# Patient Record
Sex: Male | Born: 1975 | Race: Black or African American | Hispanic: No | Marital: Single | Smoking: Former smoker
Health system: Southern US, Community
[De-identification: ages and names within clinical notes are randomized; demographics above are authoritative.]

## PROBLEM LIST (undated history)

## (undated) ENCOUNTER — Ambulatory Visit (HOSPITAL_COMMUNITY): Admission: EM | Payer: 59 | Source: Home / Self Care

## (undated) ENCOUNTER — Emergency Department (HOSPITAL_COMMUNITY): Payer: 59 | Source: Home / Self Care

## (undated) DIAGNOSIS — Z87442 Personal history of urinary calculi: Secondary | ICD-10-CM

## (undated) DIAGNOSIS — R310 Gross hematuria: Secondary | ICD-10-CM

## (undated) DIAGNOSIS — I509 Heart failure, unspecified: Secondary | ICD-10-CM

## (undated) DIAGNOSIS — Z8709 Personal history of other diseases of the respiratory system: Secondary | ICD-10-CM

## (undated) DIAGNOSIS — K5900 Constipation, unspecified: Secondary | ICD-10-CM

## (undated) DIAGNOSIS — I959 Hypotension, unspecified: Secondary | ICD-10-CM

## (undated) DIAGNOSIS — I219 Acute myocardial infarction, unspecified: Secondary | ICD-10-CM

## (undated) DIAGNOSIS — E785 Hyperlipidemia, unspecified: Secondary | ICD-10-CM

## (undated) DIAGNOSIS — I252 Old myocardial infarction: Secondary | ICD-10-CM

## (undated) DIAGNOSIS — M1990 Unspecified osteoarthritis, unspecified site: Secondary | ICD-10-CM

## (undated) DIAGNOSIS — I10 Essential (primary) hypertension: Secondary | ICD-10-CM

## (undated) DIAGNOSIS — C61 Malignant neoplasm of prostate: Secondary | ICD-10-CM

## (undated) DIAGNOSIS — G709 Myoneural disorder, unspecified: Secondary | ICD-10-CM

## (undated) DIAGNOSIS — Z8619 Personal history of other infectious and parasitic diseases: Secondary | ICD-10-CM

## (undated) DIAGNOSIS — R399 Unspecified symptoms and signs involving the genitourinary system: Secondary | ICD-10-CM

## (undated) DIAGNOSIS — K219 Gastro-esophageal reflux disease without esophagitis: Secondary | ICD-10-CM

## (undated) DIAGNOSIS — Z87898 Personal history of other specified conditions: Secondary | ICD-10-CM

## (undated) DIAGNOSIS — I7300 Raynaud's syndrome without gangrene: Secondary | ICD-10-CM

## (undated) DIAGNOSIS — J449 Chronic obstructive pulmonary disease, unspecified: Secondary | ICD-10-CM

## (undated) DIAGNOSIS — R7303 Prediabetes: Secondary | ICD-10-CM

## (undated) HISTORY — DX: Constipation, unspecified: K59.00

## (undated) HISTORY — DX: Hypotension, unspecified: I95.9

## (undated) HISTORY — DX: Chronic obstructive pulmonary disease, unspecified: J44.9

## (undated) HISTORY — DX: Malignant neoplasm of prostate: C61

## (undated) HISTORY — DX: Hyperlipidemia, unspecified: E78.5

## (undated) HISTORY — DX: Myoneural disorder, unspecified: G70.9

## (undated) HISTORY — DX: Unspecified osteoarthritis, unspecified site: M19.90

## (undated) HISTORY — DX: Heart failure, unspecified: I50.9

## (undated) HISTORY — PX: NO PAST SURGERIES: SHX2092

## (undated) HISTORY — DX: Acute myocardial infarction, unspecified: I21.9

---
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
## 1998-01-13 ENCOUNTER — Emergency Department (HOSPITAL_COMMUNITY): Admission: EM | Admit: 1998-01-13 | Discharge: 1998-01-13 | Payer: Self-pay | Admitting: Emergency Medicine

## 1999-04-04 ENCOUNTER — Emergency Department (HOSPITAL_COMMUNITY): Admission: EM | Admit: 1999-04-04 | Discharge: 1999-04-04 | Payer: Self-pay | Admitting: Emergency Medicine

## 1999-04-04 ENCOUNTER — Encounter: Payer: Self-pay | Admitting: Emergency Medicine

## 1999-04-11 ENCOUNTER — Emergency Department (HOSPITAL_COMMUNITY): Admission: EM | Admit: 1999-04-11 | Discharge: 1999-04-11 | Payer: Self-pay | Admitting: Emergency Medicine

## 1999-08-18 ENCOUNTER — Emergency Department (HOSPITAL_COMMUNITY): Admission: EM | Admit: 1999-08-18 | Discharge: 1999-08-19 | Payer: Self-pay | Admitting: Emergency Medicine

## 1999-09-21 ENCOUNTER — Emergency Department (HOSPITAL_COMMUNITY): Admission: EM | Admit: 1999-09-21 | Discharge: 1999-09-21 | Payer: Self-pay | Admitting: Emergency Medicine

## 1999-10-10 ENCOUNTER — Emergency Department (HOSPITAL_COMMUNITY): Admission: EM | Admit: 1999-10-10 | Discharge: 1999-10-10 | Payer: Self-pay | Admitting: Emergency Medicine

## 1999-10-10 ENCOUNTER — Encounter: Payer: Self-pay | Admitting: Emergency Medicine

## 2000-03-23 ENCOUNTER — Encounter: Payer: Self-pay | Admitting: Emergency Medicine

## 2000-03-23 ENCOUNTER — Emergency Department (HOSPITAL_COMMUNITY): Admission: EM | Admit: 2000-03-23 | Discharge: 2000-03-23 | Payer: Self-pay | Admitting: Emergency Medicine

## 2001-03-18 ENCOUNTER — Emergency Department (HOSPITAL_COMMUNITY): Admission: EM | Admit: 2001-03-18 | Discharge: 2001-03-18 | Payer: Self-pay | Admitting: Emergency Medicine

## 2001-03-27 ENCOUNTER — Emergency Department (HOSPITAL_COMMUNITY): Admission: EM | Admit: 2001-03-27 | Discharge: 2001-03-27 | Payer: Self-pay | Admitting: Emergency Medicine

## 2001-11-25 ENCOUNTER — Emergency Department (HOSPITAL_COMMUNITY): Admission: EM | Admit: 2001-11-25 | Discharge: 2001-11-25 | Payer: Self-pay | Admitting: *Deleted

## 2001-12-15 ENCOUNTER — Emergency Department (HOSPITAL_COMMUNITY): Admission: EM | Admit: 2001-12-15 | Discharge: 2001-12-15 | Payer: Self-pay | Admitting: Emergency Medicine

## 2001-12-15 ENCOUNTER — Encounter: Payer: Self-pay | Admitting: Emergency Medicine

## 2002-01-19 ENCOUNTER — Encounter: Payer: Self-pay | Admitting: Chiropractic Medicine

## 2002-01-19 ENCOUNTER — Ambulatory Visit (HOSPITAL_COMMUNITY): Admission: RE | Admit: 2002-01-19 | Discharge: 2002-01-19 | Payer: Self-pay | Admitting: Chiropractic Medicine

## 2002-03-13 ENCOUNTER — Emergency Department (HOSPITAL_COMMUNITY): Admission: EM | Admit: 2002-03-13 | Discharge: 2002-03-14 | Payer: Self-pay | Admitting: Emergency Medicine

## 2002-04-16 ENCOUNTER — Emergency Department (HOSPITAL_COMMUNITY): Admission: EM | Admit: 2002-04-16 | Discharge: 2002-04-16 | Payer: Self-pay | Admitting: Emergency Medicine

## 2002-06-07 ENCOUNTER — Emergency Department (HOSPITAL_COMMUNITY): Admission: EM | Admit: 2002-06-07 | Discharge: 2002-06-08 | Payer: Self-pay | Admitting: Emergency Medicine

## 2002-08-20 ENCOUNTER — Emergency Department (HOSPITAL_COMMUNITY): Admission: EM | Admit: 2002-08-20 | Discharge: 2002-08-20 | Payer: Self-pay | Admitting: Emergency Medicine

## 2002-08-20 ENCOUNTER — Encounter: Payer: Self-pay | Admitting: Emergency Medicine

## 2003-01-01 ENCOUNTER — Emergency Department (HOSPITAL_COMMUNITY): Admission: EM | Admit: 2003-01-01 | Discharge: 2003-01-02 | Payer: Self-pay | Admitting: Emergency Medicine

## 2003-06-11 ENCOUNTER — Emergency Department (HOSPITAL_COMMUNITY): Admission: EM | Admit: 2003-06-11 | Discharge: 2003-06-11 | Payer: Self-pay | Admitting: Emergency Medicine

## 2004-01-20 ENCOUNTER — Emergency Department (HOSPITAL_COMMUNITY): Admission: EM | Admit: 2004-01-20 | Discharge: 2004-01-21 | Payer: Self-pay | Admitting: Emergency Medicine

## 2004-05-12 ENCOUNTER — Emergency Department (HOSPITAL_COMMUNITY): Admission: EM | Admit: 2004-05-12 | Discharge: 2004-05-12 | Payer: Self-pay | Admitting: Emergency Medicine

## 2004-05-14 ENCOUNTER — Emergency Department (HOSPITAL_COMMUNITY): Admission: EM | Admit: 2004-05-14 | Discharge: 2004-05-14 | Payer: Self-pay | Admitting: Emergency Medicine

## 2004-07-23 ENCOUNTER — Emergency Department (HOSPITAL_COMMUNITY): Admission: EM | Admit: 2004-07-23 | Discharge: 2004-07-23 | Payer: Self-pay | Admitting: Emergency Medicine

## 2004-08-23 ENCOUNTER — Emergency Department (HOSPITAL_COMMUNITY): Admission: EM | Admit: 2004-08-23 | Discharge: 2004-08-23 | Payer: Self-pay | Admitting: Emergency Medicine

## 2004-09-18 DIAGNOSIS — R0789 Other chest pain: Secondary | ICD-10-CM | POA: Insufficient documentation

## 2005-03-08 ENCOUNTER — Emergency Department (HOSPITAL_COMMUNITY): Admission: EM | Admit: 2005-03-08 | Discharge: 2005-03-08 | Payer: Self-pay | Admitting: Emergency Medicine

## 2005-07-09 ENCOUNTER — Emergency Department (HOSPITAL_COMMUNITY): Admission: EM | Admit: 2005-07-09 | Discharge: 2005-07-09 | Payer: Self-pay | Admitting: Emergency Medicine

## 2005-12-05 ENCOUNTER — Emergency Department (HOSPITAL_COMMUNITY): Admission: EM | Admit: 2005-12-05 | Discharge: 2005-12-05 | Payer: Self-pay | Admitting: Emergency Medicine

## 2006-03-29 ENCOUNTER — Emergency Department (HOSPITAL_COMMUNITY): Admission: EM | Admit: 2006-03-29 | Discharge: 2006-03-29 | Payer: Self-pay | Admitting: Emergency Medicine

## 2006-09-12 ENCOUNTER — Ambulatory Visit: Payer: Self-pay | Admitting: Internal Medicine

## 2006-09-12 DIAGNOSIS — R5383 Other fatigue: Secondary | ICD-10-CM

## 2006-09-12 DIAGNOSIS — R5381 Other malaise: Secondary | ICD-10-CM | POA: Insufficient documentation

## 2006-09-12 LAB — CONVERTED CEMR LAB
Creatinine, Ser: 0.86 mg/dL
Hemoglobin: 15.5 g/dL
Platelets: 283 10*3/uL
TSH: 1.366 microintl units/mL

## 2006-09-24 ENCOUNTER — Ambulatory Visit: Payer: Self-pay | Admitting: Family Medicine

## 2007-03-13 DIAGNOSIS — A5601 Chlamydial cystitis and urethritis: Secondary | ICD-10-CM | POA: Insufficient documentation

## 2007-03-13 DIAGNOSIS — A5400 Gonococcal infection of lower genitourinary tract, unspecified: Secondary | ICD-10-CM | POA: Insufficient documentation

## 2007-04-25 ENCOUNTER — Emergency Department (HOSPITAL_COMMUNITY): Admission: EM | Admit: 2007-04-25 | Discharge: 2007-04-25 | Payer: Self-pay | Admitting: Family Medicine

## 2007-04-27 ENCOUNTER — Ambulatory Visit: Payer: Self-pay | Admitting: Family Medicine

## 2007-06-01 ENCOUNTER — Encounter (INDEPENDENT_AMBULATORY_CARE_PROVIDER_SITE_OTHER): Payer: Self-pay | Admitting: Internal Medicine

## 2007-06-09 ENCOUNTER — Telehealth (INDEPENDENT_AMBULATORY_CARE_PROVIDER_SITE_OTHER): Payer: Self-pay | Admitting: Internal Medicine

## 2007-06-11 DIAGNOSIS — M549 Dorsalgia, unspecified: Secondary | ICD-10-CM | POA: Insufficient documentation

## 2007-06-11 DIAGNOSIS — R0609 Other forms of dyspnea: Secondary | ICD-10-CM

## 2007-06-11 DIAGNOSIS — R0989 Other specified symptoms and signs involving the circulatory and respiratory systems: Secondary | ICD-10-CM | POA: Insufficient documentation

## 2007-06-12 ENCOUNTER — Ambulatory Visit: Payer: Self-pay | Admitting: Internal Medicine

## 2007-06-12 DIAGNOSIS — K644 Residual hemorrhoidal skin tags: Secondary | ICD-10-CM | POA: Insufficient documentation

## 2007-06-12 LAB — CONVERTED CEMR LAB
Basophils Absolute: 0.1 10*3/uL (ref 0.0–0.1)
Basophils Relative: 2 % — ABNORMAL HIGH (ref 0–1)
Eosinophils Absolute: 0.1 10*3/uL (ref 0.0–0.7)
Eosinophils Relative: 1 % (ref 0–5)
HCT: 41.8 % (ref 39.0–52.0)
Hemoglobin: 13.9 g/dL (ref 13.0–17.0)
Lymphocytes Relative: 24 % (ref 12–46)
Lymphs Abs: 2.1 10*3/uL (ref 0.7–3.3)
MCHC: 33.3 g/dL (ref 30.0–36.0)
MCV: 90.3 fL (ref 78.0–100.0)
Monocytes Absolute: 0.9 10*3/uL — ABNORMAL HIGH (ref 0.2–0.7)
Monocytes Relative: 10 % (ref 3–11)
Neutro Abs: 5.6 10*3/uL (ref 1.7–7.7)
Neutrophils Relative %: 64 % (ref 43–77)
Platelets: 251 10*3/uL (ref 150–400)
RBC: 4.63 M/uL (ref 4.22–5.81)
RDW: 13.2 % (ref 11.5–14.0)
WBC: 8.8 10*3/uL (ref 4.0–10.5)

## 2007-07-01 ENCOUNTER — Ambulatory Visit: Payer: Self-pay | Admitting: Nurse Practitioner

## 2007-07-01 DIAGNOSIS — B9789 Other viral agents as the cause of diseases classified elsewhere: Secondary | ICD-10-CM | POA: Insufficient documentation

## 2007-07-01 DIAGNOSIS — J029 Acute pharyngitis, unspecified: Secondary | ICD-10-CM | POA: Insufficient documentation

## 2007-07-01 LAB — CONVERTED CEMR LAB: Rapid Strep: NEGATIVE

## 2007-08-18 ENCOUNTER — Emergency Department (HOSPITAL_COMMUNITY): Admission: EM | Admit: 2007-08-18 | Discharge: 2007-08-18 | Payer: Self-pay | Admitting: Emergency Medicine

## 2007-09-10 ENCOUNTER — Emergency Department (HOSPITAL_COMMUNITY): Admission: EM | Admit: 2007-09-10 | Discharge: 2007-09-10 | Payer: Self-pay | Admitting: Emergency Medicine

## 2007-10-30 ENCOUNTER — Emergency Department (HOSPITAL_COMMUNITY): Admission: EM | Admit: 2007-10-30 | Discharge: 2007-10-30 | Payer: Self-pay | Admitting: Emergency Medicine

## 2008-01-02 ENCOUNTER — Emergency Department (HOSPITAL_COMMUNITY): Admission: EM | Admit: 2008-01-02 | Discharge: 2008-01-03 | Payer: Self-pay | Admitting: Emergency Medicine

## 2008-05-03 ENCOUNTER — Emergency Department (HOSPITAL_COMMUNITY): Admission: EM | Admit: 2008-05-03 | Discharge: 2008-05-03 | Payer: Self-pay | Admitting: Emergency Medicine

## 2008-07-11 ENCOUNTER — Emergency Department (HOSPITAL_COMMUNITY): Admission: EM | Admit: 2008-07-11 | Discharge: 2008-07-12 | Payer: Self-pay | Admitting: Family Medicine

## 2008-09-22 ENCOUNTER — Ambulatory Visit: Payer: Self-pay | Admitting: Internal Medicine

## 2008-09-22 DIAGNOSIS — R3589 Other polyuria: Secondary | ICD-10-CM | POA: Insufficient documentation

## 2008-09-22 DIAGNOSIS — R358 Other polyuria: Secondary | ICD-10-CM

## 2008-09-22 DIAGNOSIS — K219 Gastro-esophageal reflux disease without esophagitis: Secondary | ICD-10-CM | POA: Insufficient documentation

## 2008-09-22 LAB — CONVERTED CEMR LAB
Bilirubin Urine: NEGATIVE
Blood in Urine, dipstick: NEGATIVE
Glucose, Urine, Semiquant: NEGATIVE
Ketones, urine, test strip: NEGATIVE
Nitrite: NEGATIVE
Protein, U semiquant: NEGATIVE
Specific Gravity, Urine: 1.01
TSH: 1.172 microintl units/mL (ref 0.350–4.50)
Urobilinogen, UA: 1
WBC Urine, dipstick: NEGATIVE
pH: 7

## 2008-09-23 ENCOUNTER — Encounter (INDEPENDENT_AMBULATORY_CARE_PROVIDER_SITE_OTHER): Payer: Self-pay | Admitting: Internal Medicine

## 2009-01-26 ENCOUNTER — Ambulatory Visit: Payer: Self-pay | Admitting: Internal Medicine

## 2009-02-09 ENCOUNTER — Encounter (INDEPENDENT_AMBULATORY_CARE_PROVIDER_SITE_OTHER): Payer: Self-pay | Admitting: Internal Medicine

## 2009-03-28 ENCOUNTER — Ambulatory Visit: Payer: Self-pay | Admitting: Internal Medicine

## 2009-03-28 DIAGNOSIS — R634 Abnormal weight loss: Secondary | ICD-10-CM | POA: Insufficient documentation

## 2009-03-28 LAB — CONVERTED CEMR LAB
ALT: 12 units/L (ref 0–53)
AST: 19 units/L (ref 0–37)
Albumin: 4.6 g/dL (ref 3.5–5.2)
Alkaline Phosphatase: 67 units/L (ref 39–117)
BUN: 8 mg/dL (ref 6–23)
Basophils Absolute: 0.1 10*3/uL (ref 0.0–0.1)
Basophils Relative: 1 % (ref 0–1)
CO2: 23 meq/L (ref 19–32)
Calcium: 9.5 mg/dL (ref 8.4–10.5)
Chloride: 104 meq/L (ref 96–112)
Creatinine, Ser: 0.99 mg/dL (ref 0.40–1.50)
Eosinophils Absolute: 0.1 10*3/uL (ref 0.0–0.7)
Eosinophils Relative: 1 % (ref 0–5)
Glucose, Bld: 92 mg/dL (ref 70–99)
HCT: 45.6 % (ref 39.0–52.0)
Hemoglobin: 15.4 g/dL (ref 13.0–17.0)
Lymphocytes Relative: 30 % (ref 12–46)
Lymphs Abs: 1.6 10*3/uL (ref 0.7–4.0)
MCHC: 33.8 g/dL (ref 30.0–36.0)
MCV: 88.9 fL (ref 78.0–100.0)
Monocytes Absolute: 0.4 10*3/uL (ref 0.1–1.0)
Monocytes Relative: 7 % (ref 3–12)
Neutro Abs: 3.1 10*3/uL (ref 1.7–7.7)
Neutrophils Relative %: 60 % (ref 43–77)
Platelets: 246 10*3/uL (ref 150–400)
Potassium: 5 meq/L (ref 3.5–5.3)
RBC: 5.13 M/uL (ref 4.22–5.81)
RDW: 13 % (ref 11.5–15.5)
Sodium: 141 meq/L (ref 135–145)
TSH: 1.412 microintl units/mL (ref 0.350–4.500)
Total Bilirubin: 0.8 mg/dL (ref 0.3–1.2)
Total Protein: 7.6 g/dL (ref 6.0–8.3)
WBC: 5.2 10*3/uL (ref 4.0–10.5)

## 2009-03-29 ENCOUNTER — Emergency Department (HOSPITAL_COMMUNITY): Admission: EM | Admit: 2009-03-29 | Discharge: 2009-03-29 | Payer: Self-pay | Admitting: Family Medicine

## 2009-03-30 ENCOUNTER — Encounter (INDEPENDENT_AMBULATORY_CARE_PROVIDER_SITE_OTHER): Payer: Self-pay | Admitting: Internal Medicine

## 2009-05-30 ENCOUNTER — Emergency Department (HOSPITAL_COMMUNITY): Admission: EM | Admit: 2009-05-30 | Discharge: 2009-05-31 | Payer: Self-pay | Admitting: Emergency Medicine

## 2009-07-02 ENCOUNTER — Encounter (INDEPENDENT_AMBULATORY_CARE_PROVIDER_SITE_OTHER): Payer: Self-pay | Admitting: Internal Medicine

## 2009-07-14 ENCOUNTER — Emergency Department (HOSPITAL_COMMUNITY): Admission: EM | Admit: 2009-07-14 | Discharge: 2009-07-14 | Payer: Self-pay | Admitting: Emergency Medicine

## 2009-10-02 ENCOUNTER — Emergency Department (HOSPITAL_COMMUNITY): Admission: EM | Admit: 2009-10-02 | Discharge: 2009-10-02 | Payer: Self-pay | Admitting: Emergency Medicine

## 2011-01-12 LAB — COMPREHENSIVE METABOLIC PANEL
ALT: 14 U/L (ref 0–53)
AST: 21 U/L (ref 0–37)
Albumin: 3.8 g/dL (ref 3.5–5.2)
Alkaline Phosphatase: 66 U/L (ref 39–117)
BUN: 10 mg/dL (ref 6–23)
CO2: 26 mEq/L (ref 19–32)
Calcium: 9 mg/dL (ref 8.4–10.5)
Chloride: 107 mEq/L (ref 96–112)
Creatinine, Ser: 1.11 mg/dL (ref 0.4–1.5)
GFR calc Af Amer: >60 mL/min (ref >60)
GFR calc non Af Amer: >60 mL/min (ref >60)
Glucose, Bld: 112 mg/dL — ABNORMAL HIGH (ref 70–99)
Potassium: 3.6 mEq/L (ref 3.5–5.1)
Sodium: 139 mEq/L (ref 135–145)
Total Bilirubin: 0.6 mg/dL (ref 0.3–1.2)
Total Protein: 6.6 g/dL (ref 6.0–8.3)

## 2011-01-12 LAB — URINALYSIS, ROUTINE W REFLEX MICROSCOPIC
Bilirubin Urine: NEGATIVE
Glucose, UA: NEGATIVE mg/dL
Hgb urine dipstick: NEGATIVE
Ketones, ur: NEGATIVE mg/dL
Nitrite: NEGATIVE
Protein, ur: NEGATIVE mg/dL
Specific Gravity, Urine: 1.028 (ref 1.005–1.030)
Urobilinogen, UA: 2 mg/dL — ABNORMAL HIGH (ref 0.0–1.0)
pH: 8 (ref 5.0–8.0)

## 2011-01-12 LAB — DIFFERENTIAL
Basophils Absolute: 0 10*3/uL (ref 0.0–0.1)
Basophils Relative: 0 % (ref 0–1)
Eosinophils Absolute: 0.1 10*3/uL (ref 0.0–0.7)
Eosinophils Relative: 1 % (ref 0–5)
Lymphocytes Relative: 18 % (ref 12–46)
Lymphs Abs: 1.5 10*3/uL (ref 0.7–4.0)
Monocytes Absolute: 0.8 10*3/uL (ref 0.1–1.0)
Monocytes Relative: 9 % (ref 3–12)
Neutro Abs: 6.2 10*3/uL (ref 1.7–7.7)
Neutrophils Relative %: 72 % (ref 43–77)

## 2011-01-12 LAB — CBC
HCT: 40.8 % (ref 39.0–52.0)
Hemoglobin: 13.9 g/dL (ref 13.0–17.0)
MCHC: 34.1 g/dL (ref 30.0–36.0)
MCV: 91.4 fL (ref 78.0–100.0)
Platelets: 222 10*3/uL (ref 150–400)
RBC: 4.47 MIL/uL (ref 4.22–5.81)
RDW: 12.8 % (ref 11.5–15.5)
WBC: 8.6 10*3/uL (ref 4.0–10.5)

## 2011-01-12 LAB — LIPASE, BLOOD: Lipase: 32 U/L (ref 11–59)

## 2011-06-28 LAB — COMPREHENSIVE METABOLIC PANEL
ALT: 20
AST: 24
Albumin: 3.6
Alkaline Phosphatase: 65
BUN: 10
CO2: 27
Calcium: 8.6
Chloride: 106
Creatinine, Ser: 0.96
GFR calc Af Amer: >60
GFR calc non Af Amer: >60
Glucose, Bld: 99
Potassium: 3.9
Sodium: 137
Total Bilirubin: 0.7
Total Protein: 5.9 — ABNORMAL LOW

## 2011-06-28 LAB — URINALYSIS, ROUTINE W REFLEX MICROSCOPIC
Bilirubin Urine: NEGATIVE
Glucose, UA: NEGATIVE
Hgb urine dipstick: NEGATIVE
Nitrite: NEGATIVE
Protein, ur: NEGATIVE
Specific Gravity, Urine: 1.038 — ABNORMAL HIGH
Urobilinogen, UA: 1
pH: 6.5

## 2011-06-28 LAB — CBC
HCT: 38.6 — ABNORMAL LOW
Hemoglobin: 13.1
MCHC: 34
MCV: 88.3
Platelets: 209
RBC: 4.37
RDW: 13.5
WBC: 6.7

## 2011-06-28 LAB — DIFFERENTIAL
Basophils Absolute: 0
Basophils Relative: 1
Eosinophils Absolute: 0.1
Eosinophils Relative: 2
Lymphocytes Relative: 30
Lymphs Abs: 2
Monocytes Absolute: 0.6
Monocytes Relative: 9
Neutro Abs: 3.8
Neutrophils Relative %: 58

## 2011-06-28 LAB — LIPASE, BLOOD: Lipase: 31

## 2011-07-01 LAB — POCT I-STAT, CHEM 8
Creatinine, Ser: 1.1
Glucose, Bld: 97
Hemoglobin: 15
TCO2: 26

## 2011-07-01 LAB — DIFFERENTIAL
Basophils Absolute: 0
Basophils Relative: 0
Eosinophils Absolute: 0.1
Eosinophils Relative: 1
Lymphocytes Relative: 10 — ABNORMAL LOW
Lymphs Abs: 0.8
Monocytes Absolute: 0.7
Monocytes Relative: 8
Neutro Abs: 6.4
Neutrophils Relative %: 80 — ABNORMAL HIGH

## 2011-07-01 LAB — CBC
HCT: 40.8
Hemoglobin: 13.9
MCHC: 34.1
MCV: 89.1
Platelets: 217
RBC: 4.58
RDW: 13
WBC: 7.9

## 2011-07-01 LAB — POCT CARDIAC MARKERS
Myoglobin, poc: 41.8
Operator id: 133351

## 2011-07-08 LAB — DIFFERENTIAL
Basophils Absolute: 0
Basophils Relative: 1
Eosinophils Absolute: 0.1
Eosinophils Relative: 1
Lymphocytes Relative: 24
Lymphs Abs: 1.2
Monocytes Absolute: 0.8
Monocytes Relative: 16 — ABNORMAL HIGH
Neutro Abs: 3
Neutrophils Relative %: 59

## 2011-07-08 LAB — COMPREHENSIVE METABOLIC PANEL WITH GFR
AST: 21
Albumin: 3.7
Alkaline Phosphatase: 52
BUN: 8
CO2: 24
Chloride: 101
GFR calc non Af Amer: >60
Potassium: 3.4 — ABNORMAL LOW
Total Bilirubin: 0.9

## 2011-07-08 LAB — COMPREHENSIVE METABOLIC PANEL
ALT: 13
Calcium: 8.6
Creatinine, Ser: 0.96
GFR calc Af Amer: >60
Glucose, Bld: 88
Sodium: 133 — ABNORMAL LOW
Total Protein: 6.7

## 2011-07-08 LAB — CBC
HCT: 41.3
Hemoglobin: 13.9
MCHC: 33.6
MCV: 90.9
Platelets: 195
RBC: 4.54
RDW: 13
WBC: 5.2

## 2011-07-08 LAB — LIPASE, BLOOD: Lipase: 16

## 2011-07-08 LAB — POCT URINALYSIS DIP (DEVICE)
Hgb urine dipstick: NEGATIVE
Nitrite: NEGATIVE
Urobilinogen, UA: 1
pH: 6

## 2011-07-22 LAB — POCT URINALYSIS DIP (DEVICE)
Glucose, UA: NEGATIVE
Ketones, ur: NEGATIVE
Operator id: 239701
Protein, ur: 30 — AB
Specific Gravity, Urine: >=1.03

## 2011-07-22 LAB — URINE CULTURE
Colony Count: NO GROWTH
Culture: NO GROWTH

## 2011-07-22 LAB — COMPREHENSIVE METABOLIC PANEL
ALT: 13
Albumin: 4
Alkaline Phosphatase: 62
Calcium: 9.3
Potassium: 3.2 — ABNORMAL LOW
Sodium: 138
Total Protein: 7.1

## 2011-07-22 LAB — CBC
MCHC: 33.7
Platelets: 268
RDW: 12.7

## 2011-07-22 LAB — DIFFERENTIAL
Basophils Relative: 0
Eosinophils Absolute: 0.1
Lymphs Abs: 1.6
Monocytes Absolute: 0.4
Monocytes Relative: 6
Neutro Abs: 5.5

## 2011-08-06 ENCOUNTER — Emergency Department (HOSPITAL_COMMUNITY)
Admission: EM | Admit: 2011-08-06 | Discharge: 2011-08-06 | Disposition: A | Discharge status: Home / Self Care | Payer: 59 | Attending: Emergency Medicine | Admitting: Emergency Medicine

## 2011-08-06 DIAGNOSIS — R059 Cough, unspecified: Secondary | ICD-10-CM | POA: Insufficient documentation

## 2011-08-06 DIAGNOSIS — R05 Cough: Secondary | ICD-10-CM | POA: Insufficient documentation

## 2011-08-06 DIAGNOSIS — J40 Bronchitis, not specified as acute or chronic: Secondary | ICD-10-CM | POA: Insufficient documentation

## 2013-02-02 ENCOUNTER — Encounter (HOSPITAL_COMMUNITY): Payer: Self-pay | Admitting: Emergency Medicine

## 2013-02-02 ENCOUNTER — Emergency Department (HOSPITAL_COMMUNITY): Payer: 59

## 2013-02-02 ENCOUNTER — Emergency Department (HOSPITAL_COMMUNITY)
Admission: EM | Admit: 2013-02-02 | Discharge: 2013-02-02 | Disposition: A | Discharge status: Home / Self Care | Payer: 59 | Attending: Emergency Medicine | Admitting: Emergency Medicine

## 2013-02-02 DIAGNOSIS — S90121A Contusion of right lesser toe(s) without damage to nail, initial encounter: Secondary | ICD-10-CM

## 2013-02-02 DIAGNOSIS — S90129A Contusion of unspecified lesser toe(s) without damage to nail, initial encounter: Secondary | ICD-10-CM | POA: Insufficient documentation

## 2013-02-02 DIAGNOSIS — Y9389 Activity, other specified: Secondary | ICD-10-CM | POA: Insufficient documentation

## 2013-02-02 DIAGNOSIS — Y929 Unspecified place or not applicable: Secondary | ICD-10-CM | POA: Insufficient documentation

## 2013-02-02 DIAGNOSIS — W2203XA Walked into furniture, initial encounter: Secondary | ICD-10-CM | POA: Insufficient documentation

## 2013-02-02 MED ORDER — IBUPROFEN 800 MG PO TABS
800.0000 mg | ORAL_TABLET | Freq: Once | ORAL | Status: AC
  Administered 2013-02-02: 800 mg via ORAL
  Filled 2013-02-02: qty 1

## 2013-02-02 NOTE — ED Provider Notes (Signed)
History     CSN: [id number]  Arrival date & time 02/02/13  4098   First MD Initiated Contact with Patient 02/02/13 [phone]      Chief Complaint  Patient presents with  . Toe Injury    (Consider location/radiation/quality/duration/timing/severity/associated sxs/prior treatment) Patient is a 37 y.o. male presenting with foot injury. The history is provided by the patient.  Foot Injury Location:  Toe Time since incident:  13 hours Injury: yes   Mechanism of injury comment:  Stubbed toe on coffee table Toe location:  R third toe Pain details:    Quality:  Sharp   Radiates to:  Does not radiate   Onset quality:  Sudden   Timing:  Constant   Progression:  Unchanged Chronicity:  New Dislocation: no   Foreign body present:  No foreign bodies Prior injury to area:  No Relieved by:  Movement Worsened by:  Nothing tried Ineffective treatments:  None tried Associated symptoms: no back pain and no fatigue     History reviewed. No pertinent past medical history.  History reviewed. No pertinent past surgical history.  No family history on file.  History  Substance Use Topics  . Smoking status: Never Smoker   . Smokeless tobacco: Not on file  . Alcohol Use: Not on file     Comment: rarely      Review of Systems  Constitutional: Negative for fatigue.  Musculoskeletal: Negative for back pain.  All other systems reviewed and are negative.    Allergies  Review of patient's allergies indicates no known allergies.  Home Medications   Current Outpatient Rx  Name  Route  Sig  Dispense  Refill  . ibuprofen (ADVIL,MOTRIN) 800 MG tablet   Oral   Take 800 mg by mouth every 8 (eight) hours as needed for pain (pain).           BP 110/70  Pulse 68  Temp(Src) 97.5 F (36.4 C) (Oral)  Resp 18  SpO2 100%  Physical Exam  Nursing note and vitals reviewed. Constitutional: He is oriented to person, place, and time. He appears well-developed and well-nourished.  HENT:   Head: Normocephalic and atraumatic.  Musculoskeletal:  No external signs of trauma visualized of right foot or third toe, nail intact, no laceration or disruption of the skin, diffuse tenderness from base of third toe phalanx distally, no swelling, sensation intact, toe pink cap refill <2 secs  Neurological: He is alert and oriented to person, place, and time.  Skin: Skin is warm and dry.  Psychiatric: He has a normal mood and affect. His behavior is normal. Judgment and thought content normal.    ED Course  Procedures (including critical care time)  Labs Reviewed - No data to display Dg Foot Complete Right  02/02/2013  *RADIOLOGY REPORT*  Clinical Data: Right second and third toe injury.  RIGHT FOOT COMPLETE - 3+ VIEW  Comparison: None.  Findings: No acute bony abnormality.  Specifically, no fracture, subluxation, or dislocation.  Soft tissues are intact.  IMPRESSION: Normal study.   Original Report Authenticated By: Charlett Nose, M.D.      No diagnosis found.    MDM  Radiograph report reviewed and radiograph personally reviewed- no fx seen.        Hilario Quarry, MD 02/02/13 [phone]

## 2013-02-02 NOTE — ED Notes (Signed)
Pt presenting to ed with c/o right toe injury pt states he hit his toe on the table last night

## 2014-02-19 ENCOUNTER — Encounter (HOSPITAL_BASED_OUTPATIENT_CLINIC_OR_DEPARTMENT_OTHER): Payer: Self-pay | Admitting: Emergency Medicine

## 2014-02-19 ENCOUNTER — Emergency Department (HOSPITAL_BASED_OUTPATIENT_CLINIC_OR_DEPARTMENT_OTHER)
Admission: EM | Admit: 2014-02-19 | Discharge: 2014-02-19 | Disposition: A | Discharge status: Home / Self Care | Payer: 59 | Attending: Emergency Medicine | Admitting: Emergency Medicine

## 2014-02-19 DIAGNOSIS — Z79899 Other long term (current) drug therapy: Secondary | ICD-10-CM | POA: Insufficient documentation

## 2014-02-19 DIAGNOSIS — B349 Viral infection, unspecified: Secondary | ICD-10-CM

## 2014-02-19 DIAGNOSIS — I7300 Raynaud's syndrome without gangrene: Secondary | ICD-10-CM | POA: Insufficient documentation

## 2014-02-19 DIAGNOSIS — I252 Old myocardial infarction: Secondary | ICD-10-CM | POA: Insufficient documentation

## 2014-02-19 DIAGNOSIS — B9789 Other viral agents as the cause of diseases classified elsewhere: Secondary | ICD-10-CM | POA: Insufficient documentation

## 2014-02-19 HISTORY — DX: Raynaud's syndrome without gangrene: I73.00

## 2014-02-19 LAB — COMPREHENSIVE METABOLIC PANEL
ALT: 18 U/L (ref 0–53)
AST: 18 U/L (ref 0–37)
Albumin: 4.4 g/dL (ref 3.5–5.2)
Alkaline Phosphatase: 71 U/L (ref 39–117)
BUN: 9 mg/dL (ref 6–23)
CALCIUM: 9.6 mg/dL (ref 8.4–10.5)
CO2: 28 mEq/L (ref 19–32)
Chloride: 97 mEq/L (ref 96–112)
Creatinine, Ser: 1.2 mg/dL (ref 0.50–1.35)
GFR calc non Af Amer: 76 mL/min — ABNORMAL LOW (ref >90)
GFR, EST AFRICAN AMERICAN: 89 mL/min — AB (ref >90)
Glucose, Bld: 107 mg/dL — ABNORMAL HIGH (ref 70–99)
Potassium: 4 mEq/L (ref 3.7–5.3)
SODIUM: 137 meq/L (ref 137–147)
TOTAL PROTEIN: 7.8 g/dL (ref 6.0–8.3)
Total Bilirubin: 0.7 mg/dL (ref 0.3–1.2)

## 2014-02-19 LAB — CBC WITH DIFFERENTIAL/PLATELET
BASOS ABS: 0 10*3/uL (ref 0.0–0.1)
BASOS PCT: 0 % (ref 0–1)
EOS ABS: 0 10*3/uL (ref 0.0–0.7)
EOS PCT: 0 % (ref 0–5)
HCT: 44.6 % (ref 39.0–52.0)
Hemoglobin: 15.9 g/dL (ref 13.0–17.0)
Lymphocytes Relative: 9 % — ABNORMAL LOW (ref 12–46)
Lymphs Abs: 1 10*3/uL (ref 0.7–4.0)
MCH: 31.7 pg (ref 26.0–34.0)
MCHC: 35.7 g/dL (ref 30.0–36.0)
MCV: 88.8 fL (ref 78.0–100.0)
Monocytes Absolute: 0.9 10*3/uL (ref 0.1–1.0)
Monocytes Relative: 8 % (ref 3–12)
Neutro Abs: 8.9 10*3/uL — ABNORMAL HIGH (ref 1.7–7.7)
Neutrophils Relative %: 82 % — ABNORMAL HIGH (ref 43–77)
PLATELETS: 211 10*3/uL (ref 150–400)
RBC: 5.02 MIL/uL (ref 4.22–5.81)
RDW: 12.4 % (ref 11.5–15.5)
WBC: 10.8 10*3/uL — AB (ref 4.0–10.5)

## 2014-02-19 LAB — URINALYSIS, ROUTINE W REFLEX MICROSCOPIC
BILIRUBIN URINE: NEGATIVE
Glucose, UA: NEGATIVE mg/dL
HGB URINE DIPSTICK: NEGATIVE
Ketones, ur: 15 mg/dL — AB
Leukocytes, UA: NEGATIVE
Nitrite: NEGATIVE
PROTEIN: NEGATIVE mg/dL
Specific Gravity, Urine: 1.026 (ref 1.005–1.030)
UROBILINOGEN UA: 1 mg/dL (ref 0.0–1.0)
pH: 6 (ref 5.0–8.0)

## 2014-02-19 LAB — CBG MONITORING, ED: Glucose-Capillary: 101 mg/dL — ABNORMAL HIGH (ref 70–99)

## 2014-02-19 MED ORDER — FAMOTIDINE IN NACL 20-0.9 MG/50ML-% IV SOLN
20.0000 mg | Freq: Once | INTRAVENOUS | Status: AC
  Administered 2014-02-19: 20 mg via INTRAVENOUS
  Filled 2014-02-19: qty 50

## 2014-02-19 MED ORDER — ONDANSETRON HCL 4 MG/2ML IJ SOLN
4.0000 mg | Freq: Once | INTRAMUSCULAR | Status: AC
  Administered 2014-02-19: 4 mg via INTRAVENOUS

## 2014-02-19 MED ORDER — ONDANSETRON 4 MG PO TBDP: 4.0000 mg | ORAL_TABLET | Freq: Three times a day (TID) | ORAL | Status: DC | PRN

## 2014-02-19 MED ORDER — OMEPRAZOLE 20 MG PO CPDR: 20.0000 mg | DELAYED_RELEASE_CAPSULE | Freq: Every day | ORAL | Status: DC

## 2014-02-19 MED ORDER — ONDANSETRON HCL 4 MG/2ML IJ SOLN
4.0000 mg | Freq: Once | INTRAMUSCULAR | Status: DC
  Filled 2014-02-19: qty 2

## 2014-02-19 MED ORDER — SODIUM CHLORIDE 0.9 % IV SOLN
Freq: Once | INTRAVENOUS | Status: AC
  Administered 2014-02-19: 14:00:00 via INTRAVENOUS

## 2014-02-19 NOTE — ED Notes (Signed)
IV d/c, cath intact, pt tolerated well 

## 2014-02-19 NOTE — Discharge Instructions (Signed)

## 2014-02-19 NOTE — ED Provider Notes (Signed)
CSN: [id number]     Arrival date & time 02/19/14  1302 History   First MD Initiated Contact with Patient 02/19/14 1321     Chief Complaint  Patient presents with  . Emesis  . Chest Pain     (Consider location/radiation/quality/duration/timing/severity/associated sxs/prior Treatment) Patient is a 38 y.o. male presenting with vomiting and chest pain. The history is provided by the patient. No language interpreter was used.  Emesis Severity:  Moderate Duration:  1 day Timing:  Constant Quality:  Undigested food Able to tolerate:  Liquids Progression:  Worsening Relieved by:  Nothing Worsened by:  Nothing tried Associated symptoms: myalgias   Associated symptoms: no abdominal pain   Risk factors: no sick contacts   Chest Pain Associated symptoms: vomiting   Associated symptoms: no abdominal pain     Past Medical History  Diagnosis Date  . MI (myocardial infarction)     "stress induced"  . Borderline diabetes   . Raynaud's syndrome    No past surgical history on file. No family history on file. History  Substance Use Topics  . Smoking status: Never Smoker   . Smokeless tobacco: Not on file  . Alcohol Use: Not on file     Comment: rarely    Review of Systems  Cardiovascular: Positive for chest pain.  Gastrointestinal: Positive for vomiting. Negative for abdominal pain.  Musculoskeletal: Positive for myalgias.  All other systems reviewed and are negative.     Allergies  Review of patient's allergies indicates no known allergies.  Home Medications   Prior to Admission medications   Medication Sig Start Date End Date Taking? Authorizing Provider  amLODipine (NORVASC) 2.5 MG tablet Take 2.5 mg by mouth 2 (two) times daily.   Yes Historical Provider, MD   There were no vitals taken for this visit. Physical Exam  Nursing note and vitals reviewed. Constitutional: He is oriented to person, place, and time. He appears well-developed and well-nourished.  HENT:   Head: Normocephalic.  Right Ear: External ear normal.  Left Ear: External ear normal.  Nose: Nose normal.  Mouth/Throat: Oropharynx is clear and moist.  Eyes: Conjunctivae and EOM are normal. Pupils are equal, round, and reactive to light.  Neck: Normal range of motion.  Cardiovascular: Normal rate and normal heart sounds.   Pulmonary/Chest: Effort normal.  Abdominal: Soft. He exhibits no distension.  Musculoskeletal: Normal range of motion.  Neurological: He is alert and oriented to person, place, and time.  Skin: Skin is warm.  Psychiatric: He has a normal mood and affect.    ED Course  Procedures (including critical care time) Labs Review Labs Reviewed  URINALYSIS, ROUTINE W REFLEX MICROSCOPIC - Abnormal; Notable for the following:    Ketones, ur 15 (*)    All other components within normal limits  CBC WITH DIFFERENTIAL - Abnormal; Notable for the following:    WBC 10.8 (*)    Neutrophils Relative % 82 (*)    Neutro Abs 8.9 (*)    Lymphocytes Relative 9 (*)    All other components within normal limits  COMPREHENSIVE METABOLIC PANEL - Abnormal; Notable for the following:    Glucose, Bld 107 (*)    GFR calc non Af Amer 76 (*)    GFR calc Af Amer 89 (*)    All other components within normal limits  CBG MONITORING, ED - Abnormal; Notable for the following:    Glucose-Capillary 101 (*)    All other components within normal limits    Imaging  Review No results found.   EKG Interpretation   Date/Time:  Saturday Feb 19 2014 13:28:26 EDT Ventricular Rate:  86 PR Interval:  154 QRS Duration: 74 QT Interval:  328 QTC Calculation: 392 R Axis:   92 Text Interpretation:  Normal sinus rhythm Rightward axis Borderline ECG No  significant change since last tracing Confirmed by ZACKOWSKI  MD, SCOTT  ([id number]) on 02/19/2014 1:47:23 PM      MDM I suspect viral illness slight dehydration   15 ketones,   Wbc's 10.8   Final diagnoses:  Viral illness    Pt given Iv fluids,    zofran and pepcid.   Pt advised to see his Md on Monday for recheck.      [place], PA-C 02/19/14 [phone]

## 2014-02-19 NOTE — ED Notes (Signed)
Pt having N/V, dizziness, weakness and back pain since yesterday.  Some cold chills.  Bilateral feet pain and some pain in epigastric pain.  Some urinary urgency and frequency.  Pt having some issues with incontinence.

## 2014-02-20 NOTE — ED Provider Notes (Signed)
Medical screening examination/treatment/procedure(s) were performed by non-physician practitioner and as supervising physician I was immediately available for consultation/collaboration.   EKG Interpretation   Date/Time:  Saturday Feb 19 2014 13:28:26 EDT Ventricular Rate:  86 PR Interval:  154 QRS Duration: 74 QT Interval:  328 QTC Calculation: 392 R Axis:   92 Text Interpretation:  Normal sinus rhythm Rightward axis Borderline ECG No  significant change since last tracing Confirmed by Rogene Houston  MD, Sian Joles  ([id number]) on 02/19/2014 1:47:23 PM       Mervin Kung, MD 02/20/14 [phone]

## 2014-10-07 DIAGNOSIS — G459 Transient cerebral ischemic attack, unspecified: Secondary | ICD-10-CM

## 2014-10-07 HISTORY — DX: Transient cerebral ischemic attack, unspecified: G45.9

## 2014-10-27 ENCOUNTER — Emergency Department (HOSPITAL_BASED_OUTPATIENT_CLINIC_OR_DEPARTMENT_OTHER)
Admission: EM | Admit: 2014-10-27 | Discharge: 2014-10-27 | Disposition: A | Discharge status: Home / Self Care | Payer: 59 | Attending: Emergency Medicine | Admitting: Emergency Medicine

## 2014-10-27 ENCOUNTER — Emergency Department (HOSPITAL_BASED_OUTPATIENT_CLINIC_OR_DEPARTMENT_OTHER): Payer: 59

## 2014-10-27 ENCOUNTER — Encounter (HOSPITAL_BASED_OUTPATIENT_CLINIC_OR_DEPARTMENT_OTHER): Payer: Self-pay | Admitting: *Deleted

## 2014-10-27 DIAGNOSIS — M545 Low back pain: Secondary | ICD-10-CM | POA: Diagnosis not present

## 2014-10-27 DIAGNOSIS — R1033 Periumbilical pain: Secondary | ICD-10-CM | POA: Insufficient documentation

## 2014-10-27 DIAGNOSIS — I7300 Raynaud's syndrome without gangrene: Secondary | ICD-10-CM | POA: Diagnosis not present

## 2014-10-27 DIAGNOSIS — K625 Hemorrhage of anus and rectum: Secondary | ICD-10-CM | POA: Diagnosis present

## 2014-10-27 DIAGNOSIS — R109 Unspecified abdominal pain: Secondary | ICD-10-CM

## 2014-10-27 DIAGNOSIS — R05 Cough: Secondary | ICD-10-CM | POA: Diagnosis not present

## 2014-10-27 DIAGNOSIS — Z79899 Other long term (current) drug therapy: Secondary | ICD-10-CM | POA: Diagnosis not present

## 2014-10-27 DIAGNOSIS — I252 Old myocardial infarction: Secondary | ICD-10-CM | POA: Diagnosis not present

## 2014-10-27 DIAGNOSIS — R059 Cough, unspecified: Secondary | ICD-10-CM

## 2014-10-27 LAB — COMPREHENSIVE METABOLIC PANEL
ALT: 21 U/L (ref 0–53)
AST: 27 U/L (ref 0–37)
Albumin: 4.6 g/dL (ref 3.5–5.2)
Alkaline Phosphatase: 58 U/L (ref 39–117)
Anion gap: 6 (ref 5–15)
BILIRUBIN TOTAL: 1.1 mg/dL (ref 0.3–1.2)
BUN: 12 mg/dL (ref 6–23)
CO2: 26 mmol/L (ref 19–32)
CREATININE: 0.94 mg/dL (ref 0.50–1.35)
Calcium: 8.8 mg/dL (ref 8.4–10.5)
Chloride: 103 mEq/L (ref 96–112)
GFR calc Af Amer: >90 mL/min (ref >90)
GFR calc non Af Amer: >90 mL/min (ref >90)
GLUCOSE: 116 mg/dL — AB (ref 70–99)
Potassium: 3.6 mmol/L (ref 3.5–5.1)
Sodium: 135 mmol/L (ref 135–145)
TOTAL PROTEIN: 7.9 g/dL (ref 6.0–8.3)

## 2014-10-27 LAB — URINALYSIS, ROUTINE W REFLEX MICROSCOPIC
Bilirubin Urine: NEGATIVE
Glucose, UA: NEGATIVE mg/dL
HGB URINE DIPSTICK: NEGATIVE
KETONES UR: 15 mg/dL — AB
LEUKOCYTES UA: NEGATIVE
Nitrite: NEGATIVE
PH: 5.5 (ref 5.0–8.0)
Protein, ur: NEGATIVE mg/dL
Specific Gravity, Urine: 1.027 (ref 1.005–1.030)
UROBILINOGEN UA: 1 mg/dL (ref 0.0–1.0)

## 2014-10-27 LAB — CBC WITH DIFFERENTIAL/PLATELET
Basophils Absolute: 0 10*3/uL (ref 0.0–0.1)
Basophils Relative: 0 % (ref 0–1)
EOS ABS: 0 10*3/uL (ref 0.0–0.7)
EOS PCT: 0 % (ref 0–5)
HEMATOCRIT: 46.4 % (ref 39.0–52.0)
Hemoglobin: 16 g/dL (ref 13.0–17.0)
LYMPHS ABS: 0.5 10*3/uL — AB (ref 0.7–4.0)
LYMPHS PCT: 4 % — AB (ref 12–46)
MCH: 30 pg (ref 26.0–34.0)
MCHC: 34.5 g/dL (ref 30.0–36.0)
MCV: 87.1 fL (ref 78.0–100.0)
MONO ABS: 0.5 10*3/uL (ref 0.1–1.0)
MONOS PCT: 4 % (ref 3–12)
Neutro Abs: 10.5 10*3/uL — ABNORMAL HIGH (ref 1.7–7.7)
Neutrophils Relative %: 92 % — ABNORMAL HIGH (ref 43–77)
Platelets: 254 10*3/uL (ref 150–400)
RBC: 5.33 MIL/uL (ref 4.22–5.81)
RDW: 12.6 % (ref 11.5–15.5)
WBC: 11.5 10*3/uL — ABNORMAL HIGH (ref 4.0–10.5)

## 2014-10-27 LAB — LIPASE, BLOOD: LIPASE: 27 U/L (ref 11–59)

## 2014-10-27 LAB — TROPONIN I: Troponin I: <0.03 ng/mL (ref <0.031)

## 2014-10-27 LAB — OCCULT BLOOD X 1 CARD TO LAB, STOOL: FECAL OCCULT BLD: NEGATIVE

## 2014-10-27 MED ORDER — ONDANSETRON HCL 8 MG PO TABS: 8.0000 mg | ORAL_TABLET | Freq: Three times a day (TID) | ORAL | Status: DC | PRN

## 2014-10-27 MED ORDER — SODIUM CHLORIDE 0.9 % IV BOLUS (SEPSIS)
2000.0000 mL | Freq: Once | INTRAVENOUS | Status: AC
  Administered 2014-10-27: 2000 mL via INTRAVENOUS

## 2014-10-27 MED ORDER — ONDANSETRON HCL 4 MG/2ML IJ SOLN
4.0000 mg | Freq: Once | INTRAMUSCULAR | Status: AC
  Administered 2014-10-27: 4 mg via INTRAVENOUS
  Filled 2014-10-27: qty 2

## 2014-10-27 MED ORDER — HYDROCODONE-ACETAMINOPHEN 5-325 MG PO TABS: 1.0000 | ORAL_TABLET | Freq: Four times a day (QID) | ORAL | Status: DC | PRN

## 2014-10-27 MED ORDER — MORPHINE SULFATE 4 MG/ML IJ SOLN
6.0000 mg | Freq: Once | INTRAMUSCULAR | Status: AC
  Administered 2014-10-27: 6 mg via INTRAVENOUS
  Filled 2014-10-27: qty 2

## 2014-10-27 MED ORDER — SODIUM CHLORIDE 0.9 % IV SOLN
1000.0000 mL | Freq: Once | INTRAVENOUS | Status: AC
  Administered 2014-10-27: 1000 mL via INTRAVENOUS

## 2014-10-27 NOTE — ED Provider Notes (Addendum)
CSN: [id number]     Arrival date & time 10/27/14  1904 History  This chart was scribed for George Dakin, MD by Peyton Bottoms, ED Scribe. This patient was seen in room MH10/MH10 and the patient's care was started at 8:01 PM.   Chief Complaint  Patient presents with  . Rectal Bleeding   Patient is a 39 y.o. male presenting with hematochezia. The history is provided by the patient. No language interpreter was used.  Rectal Bleeding Amount:  Scant Duration:  1 day Timing:  Intermittent Progression:  Waxing and waning Chronicity:  New Associated symptoms: abdominal pain and vomiting    HPI Comments: George Gonzales is a 39 y.o. male with a PMHx of MI at age 89, borderline diabetes and Raynaud's syndrom, who presents to the Emergency Department complaining of moderate periumbilical pain that began earlier today. He reports associated hematochezia. He describes it to have seen a small amount of pure blood in the toilet water. He also reports associated emesis and hematemesis. He states that the vomit had a small amount of blood and undigested food in it. He states he took pepto bismol and theraflu with no relief. He states he had onset of chills afterward. He drank water with no relief. He reports multiple episodes of bowel movements with some blood. His last bowel movement had a tiny amount of blood in it He currently reports his pain to be 3/10. He also reports bilateral lower back pain which worsens with laying down, movement, and changing positions. He states that standing improves his back pain. Patient states that he was a former smoker and stopped smoking 7 years ago. He is currently taking BP medications and acid reflux medications prescribed by PCP. Also complains of nonproductive cough for the past 3 days``  Past Medical History  Diagnosis Date  . MI (myocardial infarction)     "stress induced"  . Borderline diabetes   . Raynaud's syndrome    History reviewed. No pertinent past  surgical history. History reviewed. No pertinent family history. History  Substance Use Topics  . Smoking status: Never Smoker   . Smokeless tobacco: Not on file  . Alcohol Use: No     Comment: rarely   Review of Systems  Constitutional: Positive for chills.  Respiratory: Positive for cough.   Gastrointestinal: Positive for vomiting, abdominal pain, blood in stool and hematochezia.  Musculoskeletal: Positive for back pain.  All other systems reviewed and are negative.  Allergies  Review of patient's allergies indicates no known allergies.  Home Medications   Prior to Admission medications   Medication Sig Start Date End Date Taking? Authorizing Provider  amLODipine (NORVASC) 2.5 MG tablet Take 2.5 mg by mouth 2 (two) times daily.    Historical Provider, MD  omeprazole (PRILOSEC) 20 MG capsule Take 1 capsule (20 mg total) by mouth daily. 02/19/14   Fransico Meadow, PA-C  ondansetron (ZOFRAN ODT) 4 MG disintegrating tablet Take 1 tablet (4 mg total) by mouth every 8 (eight) hours as needed. 02/19/14   Fransico Meadow, PA-C   Triage Vitals: BP 121/73 mmHg  Pulse 120  Temp(Src) 98.8 F (37.1 C) (Oral)  Resp 18  Ht 5\' 10"  (1.778 m)  Wt 150 lb (68.04 kg)  BMI 21.52 kg/m2  SpO2 100%  Physical Exam  Constitutional: He appears well-developed and well-nourished. He appears distressed.  Alert Glasgow Coma Score 15 appears mildly uncomfortable  HENT:  Head: Normocephalic and atraumatic.  Eyes: Conjunctivae are normal. Pupils are  equal, round, and reactive to light.  Neck: Neck supple. No tracheal deviation present. No thyromegaly present.  Cardiovascular: Normal rate and regular rhythm.   No murmur heard. Pulmonary/Chest: Effort normal and breath sounds normal.  Abdominal: Soft. Bowel sounds are normal. He exhibits no distension. There is no tenderness.  Genitourinary: Rectum normal and penis normal.  No scrotal masses. No signs of hernia. Nontender no gross blood Brown stool   Musculoskeletal: Normal range of motion. He exhibits no edema or tenderness.  No point tenderness along spine. No flank tenderness.  Neurological: He is alert. Coordination normal.  Skin: Skin is warm and dry. No rash noted.  Psychiatric: He has a normal mood and affect.  Nursing note and vitals reviewed.  ED Course  Procedures (including critical care time)  DIAGNOSTIC STUDIES: Oxygen Saturation is 100% on RA, normal by my interpretation.    COORDINATION OF CARE: 8:05 PM- Discussed plans to order diagnostic EKG, lab work and urinalysis. Will give patient IV fluids and Zofran. Pt advised of plan for treatment and pt agrees.  Labs Review Labs Reviewed  CBC WITH DIFFERENTIAL - Abnormal; Notable for the following:    WBC 11.5 (*)    Neutrophils Relative % 92 (*)    Neutro Abs 10.5 (*)    Lymphocytes Relative 4 (*)    Lymphs Abs 0.5 (*)    All other components within normal limits  COMPREHENSIVE METABOLIC PANEL  LIPASE, BLOOD  TROPONIN I  URINALYSIS, ROUTINE W REFLEX MICROSCOPIC   Imaging Review No results found.   EKG Interpretation   Date/Time:  Thursday October 27 2014 19:38:36 EST Ventricular Rate:  106 PR Interval:  158 QRS Duration: 72 QT Interval:  304 QTC Calculation: 403 R Axis:   66 Text Interpretation:  Sinus tachycardia Right atrial enlargement  Borderline ECG SINCE LAST TRACING HEART RATE HAS INCREASED Confirmed by  Winfred Leeds  MD, Ruthe Roemer [id number]) on 10/27/2014 8:45:27 PM      Feels much improved after treatment with intravenous morphine and intravenous fluids.Feels ready to go home. Able to drink without difficulty. Results for orders placed or performed during the hospital encounter of 10/27/14  CBC with Differential  Result Value Ref Range   WBC 11.5 (H) 4.0 - 10.5 K/uL   RBC 5.33 4.22 - 5.81 MIL/uL   Hemoglobin 16.0 13.0 - 17.0 g/dL   HCT 46.4 39.0 - 52.0 %   MCV 87.1 78.0 - 100.0 fL   MCH 30.0 26.0 - 34.0 pg   MCHC 34.5 30.0 - 36.0 g/dL   RDW 12.6  11.5 - 15.5 %   Platelets 254 150 - 400 K/uL   Neutrophils Relative % 92 (H) 43 - 77 %   Neutro Abs 10.5 (H) 1.7 - 7.7 K/uL   Lymphocytes Relative 4 (L) 12 - 46 %   Lymphs Abs 0.5 (L) 0.7 - 4.0 K/uL   Monocytes Relative 4 3 - 12 %   Monocytes Absolute 0.5 0.1 - 1.0 K/uL   Eosinophils Relative 0 0 - 5 %   Eosinophils Absolute 0.0 0.0 - 0.7 K/uL   Basophils Relative 0 0 - 1 %   Basophils Absolute 0.0 0.0 - 0.1 K/uL  Comprehensive metabolic panel  Result Value Ref Range   Sodium 135 135 - 145 mmol/L   Potassium 3.6 3.5 - 5.1 mmol/L   Chloride 103 96 - 112 mEq/L   CO2 26 19 - 32 mmol/L   Glucose, Bld 116 (H) 70 - 99 mg/dL   BUN  12 6 - 23 mg/dL   Creatinine, Ser 0.94 0.50 - 1.35 mg/dL   Calcium 8.8 8.4 - 10.5 mg/dL   Total Protein 7.9 6.0 - 8.3 g/dL   Albumin 4.6 3.5 - 5.2 g/dL   AST 27 0 - 37 U/L   ALT 21 0 - 53 U/L   Alkaline Phosphatase 58 39 - 117 U/L   Total Bilirubin 1.1 0.3 - 1.2 mg/dL   GFR calc non Af Amer >90 >90 mL/min   GFR calc Af Amer >90 >90 mL/min   Anion gap 6 5 - 15  Lipase, blood  Result Value Ref Range   Lipase 27 11 - 59 U/L  Troponin I  (only if pt is 37 y.o. or older and pain is above umbilicus)  Result Value Ref Range   Troponin I <0.03 <0.031 ng/mL  Urinalysis, Routine w reflex microscopic  Result Value Ref Range   Color, Urine YELLOW YELLOW   APPearance CLEAR CLEAR   Specific Gravity, Urine 1.027 1.005 - 1.030   pH 5.5 5.0 - 8.0   Glucose, UA NEGATIVE NEGATIVE mg/dL   Hgb urine dipstick NEGATIVE NEGATIVE   Bilirubin Urine NEGATIVE NEGATIVE   Ketones, ur 15 (A) NEGATIVE mg/dL   Protein, ur NEGATIVE NEGATIVE mg/dL   Urobilinogen, UA 1.0 0.0 - 1.0 mg/dL   Nitrite NEGATIVE NEGATIVE   Leukocytes, UA NEGATIVE NEGATIVE  Occult blood card to lab, stool Provider will collect  Result Value Ref Range   Fecal Occult Bld NEGATIVE NEGATIVE   Dg Chest 2 View  10/27/2014   CLINICAL DATA:  Cough, Chest pain, chills, Rectal bleeding and hemoptysis x early  this AM. HX: MI, Borderline diabetic, pt on HTN meds for Raynaud syndrome.  EXAM: CHEST  2 VIEW  COMPARISON:  10/02/2009  FINDINGS: Normal heart, mediastinum and hila. Clear lungs. No pleural effusion or pneumothorax. Bony thorax is unremarkable.  IMPRESSION: Normal chest radiographs.   Electronically Signed   By: Lajean Manes M.D.   On: 10/27/2014 22:22    MDM  Patient with Hemoccult negative rectal exam. Nontender abdominal exam. He reports chills. I suspect the patient suffering from viral illness. Plan encourage oral hydration. Prescription Norco, Zofran. Follow-up Dr.Avbuere if not continuing to feel improved by next week Final diagnoses:  None  diagnosis#1 abdominal pain #2back pain #3 cough     I personally performed the services described in this documentation, which was scribed in my presence. The recorded information has been reviewed and is accurate.  George Dakin, MD 10/27/14 7124  George Dakin, MD 10/27/14 2243

## 2014-10-27 NOTE — Discharge Instructions (Signed)
Abdominal Pain Drink at least six 8 ounce glasses of water daily to stay well-hydrated. Take Tylenol for mild pain or the pain medicine prescribed for bad pain. Follow-up with Dr.Avbuere if you continue to have pain by next week. Return here immediately if vomiting or pain not well controlled with the medication prescribed,or if you have more blood in bowel movements or vomiting more blood Many things can cause abdominal pain. Usually, abdominal pain is not caused by a disease and will improve without treatment. It can often be observed and treated at home. Your health care provider will do a physical exam and possibly order blood tests and X-rays to help determine the seriousness of your pain. However, in many cases, more time must pass before a clear cause of the pain can be found. Before that point, your health care provider may not know if you need more testing or further treatment. HOME CARE INSTRUCTIONS  Monitor your abdominal pain for any changes. The following actions may help to alleviate any discomfort you are experiencing:  Only take over-the-counter or prescription medicines as directed by your health care provider.  Do not take laxatives unless directed to do so by your health care provider.  Try a clear liquid diet (broth, tea, or water) as directed by your health care provider. Slowly move to a bland diet as tolerated. SEEK MEDICAL CARE IF:  You have unexplained abdominal pain.  You have abdominal pain associated with nausea or diarrhea.  You have pain when you urinate or have a bowel movement.  You experience abdominal pain that wakes you in the night.  You have abdominal pain that is worsened or improved by eating food.  You have abdominal pain that is worsened with eating fatty foods.  You have a fever. SEEK IMMEDIATE MEDICAL CARE IF:   Your pain does not go away within 2 hours.  You keep throwing up (vomiting).  Your pain is felt only in portions of the abdomen,  such as the right side or the left lower portion of the abdomen.  You pass bloody or black tarry stools. MAKE SURE YOU:  Understand these instructions.   Will watch your condition.   Will get help right away if you are not doing well or get worse.  Document Released: 07/03/2005 Document Revised: 09/28/2013 Document Reviewed: 06/02/2013 [facility] Patient Information 2015 [place], [place]. This information is not intended to replace advice given to you by your health care provider. Make sure you discuss any questions you have with your health care provider.

## 2014-10-27 NOTE — ED Notes (Signed)
Pt c/o rectal bleeding this am with abd pan and vomiting

## 2014-12-19 ENCOUNTER — Encounter (HOSPITAL_BASED_OUTPATIENT_CLINIC_OR_DEPARTMENT_OTHER): Payer: Self-pay | Admitting: Emergency Medicine

## 2014-12-19 ENCOUNTER — Emergency Department (HOSPITAL_BASED_OUTPATIENT_CLINIC_OR_DEPARTMENT_OTHER)
Admission: EM | Admit: 2014-12-19 | Discharge: 2014-12-20 | Discharge status: Against Medical Advice | Payer: 59 | Attending: Emergency Medicine | Admitting: Emergency Medicine

## 2014-12-19 DIAGNOSIS — R319 Hematuria, unspecified: Secondary | ICD-10-CM | POA: Diagnosis not present

## 2014-12-19 LAB — URINALYSIS, ROUTINE W REFLEX MICROSCOPIC
Bilirubin Urine: NEGATIVE
Glucose, UA: NEGATIVE mg/dL
HGB URINE DIPSTICK: NEGATIVE
KETONES UR: NEGATIVE mg/dL
Leukocytes, UA: NEGATIVE
Nitrite: NEGATIVE
Protein, ur: NEGATIVE mg/dL
Specific Gravity, Urine: 1.022 (ref 1.005–1.030)
UROBILINOGEN UA: 1 mg/dL (ref 0.0–1.0)
pH: 6 (ref 5.0–8.0)

## 2014-12-19 NOTE — ED Notes (Signed)
Pt states he has had blood in his urine since yesterday, past HX of prostate cancer.

## 2014-12-19 NOTE — ED Notes (Signed)
Patient callled x3 no answer

## 2015-02-10 ENCOUNTER — Other Ambulatory Visit: Payer: Self-pay | Admitting: Urology

## 2015-02-15 ENCOUNTER — Encounter (HOSPITAL_BASED_OUTPATIENT_CLINIC_OR_DEPARTMENT_OTHER): Payer: Self-pay | Admitting: *Deleted

## 2015-02-16 ENCOUNTER — Encounter (HOSPITAL_BASED_OUTPATIENT_CLINIC_OR_DEPARTMENT_OTHER): Payer: Self-pay | Admitting: *Deleted

## 2015-02-16 NOTE — Progress Notes (Addendum)
NPO AFTER MN.  ARRIVE AT 0930.  NEEDS ISTAT.  CURRENT EKG , LOV NOTE, AND STRESS TEST RESULTS TO BE FAXED FROM [facility].  WILL TAKE AM MEDS W/ SIPS OF WATER DOS.  REVIEWED CHART W/ DR CARIGNAN MDA,  OK TO PROCEED AND MDA DOS W/ ASSESS PT IF EKG NEEDS REPEATING.

## 2015-02-17 ENCOUNTER — Encounter (HOSPITAL_BASED_OUTPATIENT_CLINIC_OR_DEPARTMENT_OTHER): Payer: Self-pay | Admitting: Anesthesiology

## 2015-02-17 ENCOUNTER — Ambulatory Visit (HOSPITAL_BASED_OUTPATIENT_CLINIC_OR_DEPARTMENT_OTHER): Payer: 59 | Admitting: Anesthesiology

## 2015-02-17 ENCOUNTER — Encounter (HOSPITAL_BASED_OUTPATIENT_CLINIC_OR_DEPARTMENT_OTHER)
Admission: RE | Disposition: A | Discharge status: Home / Self Care | Payer: Self-pay | Source: Ambulatory Visit | Attending: Urology

## 2015-02-17 ENCOUNTER — Ambulatory Visit (HOSPITAL_BASED_OUTPATIENT_CLINIC_OR_DEPARTMENT_OTHER)
Admission: RE | Admit: 2015-02-17 | Discharge: 2015-02-17 | Disposition: A | Discharge status: Home / Self Care | Payer: 59 | Source: Ambulatory Visit | Attending: Urology | Admitting: Urology

## 2015-02-17 DIAGNOSIS — I739 Peripheral vascular disease, unspecified: Secondary | ICD-10-CM | POA: Insufficient documentation

## 2015-02-17 DIAGNOSIS — I10 Essential (primary) hypertension: Secondary | ICD-10-CM | POA: Insufficient documentation

## 2015-02-17 DIAGNOSIS — R310 Gross hematuria: Secondary | ICD-10-CM | POA: Diagnosis not present

## 2015-02-17 DIAGNOSIS — I2510 Atherosclerotic heart disease of native coronary artery without angina pectoris: Secondary | ICD-10-CM | POA: Insufficient documentation

## 2015-02-17 DIAGNOSIS — Z87891 Personal history of nicotine dependence: Secondary | ICD-10-CM | POA: Insufficient documentation

## 2015-02-17 DIAGNOSIS — Z881 Allergy status to other antibiotic agents status: Secondary | ICD-10-CM | POA: Diagnosis not present

## 2015-02-17 DIAGNOSIS — E119 Type 2 diabetes mellitus without complications: Secondary | ICD-10-CM | POA: Diagnosis not present

## 2015-02-17 DIAGNOSIS — I252 Old myocardial infarction: Secondary | ICD-10-CM | POA: Diagnosis not present

## 2015-02-17 DIAGNOSIS — I7300 Raynaud's syndrome without gangrene: Secondary | ICD-10-CM | POA: Insufficient documentation

## 2015-02-17 HISTORY — DX: Unspecified symptoms and signs involving the genitourinary system: R39.9

## 2015-02-17 HISTORY — DX: Essential (primary) hypertension: I10

## 2015-02-17 HISTORY — DX: Prediabetes: R73.03

## 2015-02-17 HISTORY — PX: CYSTOSCOPY: SHX5120

## 2015-02-17 HISTORY — DX: Personal history of other infectious and parasitic diseases: Z86.19

## 2015-02-17 HISTORY — DX: Personal history of other specified conditions: Z87.898

## 2015-02-17 HISTORY — DX: Old myocardial infarction: I25.2

## 2015-02-17 HISTORY — DX: Personal history of urinary calculi: Z87.442

## 2015-02-17 HISTORY — DX: Gross hematuria: R31.0

## 2015-02-17 HISTORY — DX: Gastro-esophageal reflux disease without esophagitis: K21.9

## 2015-02-17 HISTORY — DX: Personal history of other diseases of the respiratory system: Z87.09

## 2015-02-17 LAB — POCT I-STAT 4, (NA,K, GLUC, HGB,HCT)
Glucose, Bld: 95 mg/dL (ref 65–99)
HEMATOCRIT: 49 % (ref 39.0–52.0)
Hemoglobin: 16.7 g/dL (ref 13.0–17.0)
Potassium: 3.9 mmol/L (ref 3.5–5.1)
Sodium: 141 mmol/L (ref 135–145)

## 2015-02-17 SURGERY — CYSTOSCOPY
Anesthesia: General | Site: Bladder

## 2015-02-17 MED ORDER — HYDROCODONE-ACETAMINOPHEN 5-325 MG PO TABS
ORAL_TABLET | ORAL | Status: AC
  Filled 2015-02-17: qty 1

## 2015-02-17 MED ORDER — LACTATED RINGERS IV SOLN
INTRAVENOUS | Status: DC
  Administered 2015-02-17 (×2): via INTRAVENOUS
  Filled 2015-02-17: qty 1000

## 2015-02-17 MED ORDER — HYDROCODONE-ACETAMINOPHEN 5-325 MG PO TABS
1.0000 | ORAL_TABLET | Freq: Once | ORAL | Status: AC
  Administered 2015-02-17: 1 via ORAL
  Filled 2015-02-17: qty 1

## 2015-02-17 MED ORDER — LIDOCAINE HCL (CARDIAC) 20 MG/ML IV SOLN
INTRAVENOUS | Status: DC | PRN
  Administered 2015-02-17: 100 mg via INTRAVENOUS

## 2015-02-17 MED ORDER — CEFAZOLIN SODIUM-DEXTROSE 2-3 GM-% IV SOLR
INTRAVENOUS | Status: AC
  Filled 2015-02-17: qty 50

## 2015-02-17 MED ORDER — ONDANSETRON HCL 4 MG/2ML IJ SOLN
4.0000 mg | Freq: Once | INTRAMUSCULAR | Status: DC | PRN
Start: 2015-02-17 — End: 2015-02-17
  Filled 2015-02-17: qty 2

## 2015-02-17 MED ORDER — FENTANYL CITRATE (PF) 100 MCG/2ML IJ SOLN
INTRAMUSCULAR | Status: DC | PRN
  Administered 2015-02-17: 50 ug via INTRAVENOUS

## 2015-02-17 MED ORDER — ONDANSETRON HCL 4 MG/2ML IJ SOLN
INTRAMUSCULAR | Status: DC | PRN
  Administered 2015-02-17: 4 mg via INTRAVENOUS

## 2015-02-17 MED ORDER — MIDAZOLAM HCL 2 MG/2ML IJ SOLN
INTRAMUSCULAR | Status: AC
  Filled 2015-02-17: qty 2

## 2015-02-17 MED ORDER — FLUMAZENIL 0.5 MG/5ML IV SOLN
INTRAVENOUS | Status: AC
  Filled 2015-02-17: qty 5

## 2015-02-17 MED ORDER — PROPOFOL 10 MG/ML IV BOLUS
INTRAVENOUS | Status: DC | PRN
  Administered 2015-02-17: 300 mg via INTRAVENOUS

## 2015-02-17 MED ORDER — MIDAZOLAM HCL 5 MG/5ML IJ SOLN
INTRAMUSCULAR | Status: DC | PRN
Start: 2015-02-17 — End: 2015-02-17
  Administered 2015-02-17: 2 mg via INTRAVENOUS

## 2015-02-17 MED ORDER — FENTANYL CITRATE (PF) 100 MCG/2ML IJ SOLN
INTRAMUSCULAR | Status: AC
  Filled 2015-02-17: qty 4

## 2015-02-17 MED ORDER — DEXAMETHASONE SODIUM PHOSPHATE 4 MG/ML IJ SOLN
INTRAMUSCULAR | Status: DC | PRN
  Administered 2015-02-17: 10 mg via INTRAVENOUS

## 2015-02-17 MED ORDER — STERILE WATER FOR IRRIGATION IR SOLN
Status: DC | PRN
  Administered 2015-02-17: 3000 mL

## 2015-02-17 MED ORDER — FLUMAZENIL 0.5 MG/5ML IV SOLN
INTRAVENOUS | Status: DC | PRN
  Administered 2015-02-17: .2 mg via INTRAVENOUS
  Administered 2015-02-17: 0.2 mg via INTRAVENOUS

## 2015-02-17 SURGICAL SUPPLY — 18 items
BAG DRAIN URO-CYSTO SKYTR STRL (DRAIN) ×2 IMPLANT
CANISTER SUCT LVC 12 LTR MEDI- (MISCELLANEOUS) ×2 IMPLANT
CLOTH BEACON ORANGE TIMEOUT ST (SAFETY) ×2 IMPLANT
ELECT REM PT RETURN 9FT ADLT (ELECTROSURGICAL)
ELECTRODE REM PT RTRN 9FT ADLT (ELECTROSURGICAL) IMPLANT
GLOVE BIO SURGEON STRL SZ 6.5 (GLOVE) ×2 IMPLANT
GLOVE BIO SURGEON STRL SZ7 (GLOVE) ×2 IMPLANT
GLOVE BIOGEL PI IND STRL 6.5 (GLOVE) ×2 IMPLANT
GLOVE BIOGEL PI INDICATOR 6.5 (GLOVE) ×2
GOWN STRL REUS W/ TWL LRG LVL3 (GOWN DISPOSABLE) ×2 IMPLANT
GOWN STRL REUS W/TWL LRG LVL3 (GOWN DISPOSABLE) ×2
NDL SAFETY ECLIPSE 18X1.5 (NEEDLE) IMPLANT
NEEDLE HYPO 18GX1.5 SHARP (NEEDLE)
NEEDLE HYPO 22GX1.5 SAFETY (NEEDLE) IMPLANT
NS IRRIG 500ML POUR BTL (IV SOLUTION) IMPLANT
PACK CYSTO (CUSTOM PROCEDURE TRAY) ×2 IMPLANT
SYR 20CC LL (SYRINGE) IMPLANT
WATER STERILE IRR 3000ML UROMA (IV SOLUTION) ×2 IMPLANT

## 2015-02-17 NOTE — Anesthesia Preprocedure Evaluation (Addendum)
Anesthesia Evaluation  Patient identified by MRN, date of birth, ID band Patient awake    Reviewed: Allergy & Precautions, NPO status , Patient's Chart, lab work & pertinent test results  Airway Mallampati: I  TM Distance: >3 FB Neck ROM: Full    Dental  (+) Poor Dentition, Dental Advisory Given,    Pulmonary former smoker,    Pulmonary exam normal       Cardiovascular hypertension, Pt. on medications + CAD, + Past MI and + Peripheral Vascular Disease Normal cardiovascular exam    Neuro/Psych    GI/Hepatic   Endo/Other  diabetes, Type 2  Renal/GU Renal disease     Musculoskeletal   Abdominal   Peds  Hematology   Anesthesia Other Findings STS Raynauds Hematuria Multiple fractured teeth to the gum line  Reproductive/Obstetrics                           Anesthesia Physical Anesthesia Plan  ASA: III  Anesthesia Plan: General   Post-op Pain Management:    Induction:   Airway Management Planned: LMA and Oral ETT  Additional Equipment:   Intra-op Plan:   Post-operative Plan: Extubation in OR  Informed Consent: I have reviewed the patients History and Physical, chart, labs and discussed the procedure including the risks, benefits and alternatives for the proposed anesthesia with the patient or authorized representative who has indicated his/her understanding and acceptance.     Plan Discussed with: CRNA, Anesthesiologist and Surgeon  Anesthesia Plan Comments:         Anesthesia Quick Evaluation

## 2015-02-17 NOTE — Anesthesia Postprocedure Evaluation (Signed)
  Anesthesia Post-op Note  Patient: George Gonzales  Procedure(s) Performed: Procedure(s): CYSTOSCOPY (N/A)  Patient Location: PACU  Anesthesia Type:General  Level of Consciousness: awake, oriented, sedated and patient cooperative  Airway and Oxygen Therapy: Patient Spontanous Breathing  Post-op Pain: none  Post-op Assessment: Post-op Vital signs reviewed, Patient's Cardiovascular Status Stable, Respiratory Function Stable, Patent Airway, No signs of Nausea or vomiting and Pain level controlled  Post-op Vital Signs: stable  Last Vitals:  Filed Vitals:   02/17/15 1207  BP: 103/68  Pulse: 50  Temp: 36.4 C  Resp: 12    Complications: No apparent anesthesia complications

## 2015-02-17 NOTE — H&P (Signed)
History of Present Illness 39 YO male returns today for follow-up gross hematuria. He was last seen by Jimmey Ralph, NP on 01/13/15 for intermittent gross hematuria for 4-6 weeks. Was seen at [facility] ER in [place] and treated for possible UTI with Cephalexin but he stopped ABX because he felt that hematuria with worse with ABX. Hematuria has been noted throughout stream with small clots and has also noticed blood in semen with masturbating. He has not seen any more blood in his urine. He still has suprapubic discomfort. CT scan shows unremarkable kidneys, large amount of stools in the colon. He quit smoking 8 years ago and had smoked 1-2 cigarettes a day for 2-3 years. Has known FH of PCa on his father's side. His PSA is 1.84.   Past Medical History Problems  1. History of Acute Myocardial Infarction 2. History of heartburn (D22.025)  Surgical History Problems  1. History of No Surgical Problems  Current Meds 1. Felodipine ER TB24;  Therapy: (Recorded:08Apr2016) to Recorded 2. Hydrocodone-Acetaminophen 5-325 MG Oral Tablet;  Therapy: (Recorded:08Apr2016) to Recorded 3. Omeprazole 40 MG Oral Capsule Delayed Release;  Therapy: (Recorded:11Jun2013) to Recorded  Allergies Medication  1. Cephalexin CAPS  Family History Problems  1. Family history of malignant neoplasm of prostate (Z80.42) : Multiple Family Members  Social History Problems  1. Alcohol Use 2. Caffeine Use 3. Former smoker [phone]) 4. Marital History - Single 5. Occupation:   Retail buyer. 6. History of Tobacco use (Z72.0)   approx 2 packs/month  Review of Systems Genitourinary, constitutional, skin, eye, otolaryngeal, hematologic/lymphatic, cardiovascular, pulmonary, endocrine, musculoskeletal, gastrointestinal, neurological and psychiatric system(s) were reviewed and pertinent findings if present are noted and are otherwise negative.  Genitourinary: hematuria.    Physical Exam Constitutional:  Well nourished and well developed . No acute distress.   ENT:. The ears and nose are normal in appearance.   Neck: The appearance of the neck is normal and no neck mass is present.   Pulmonary: No respiratory distress and normal respiratory rhythm and effort.   Cardiovascular: Heart rate and rhythm are normal . No peripheral edema.   Abdomen: The abdomen is soft and nontender. No masses are palpated. No CVA tenderness. No hernias are palpable. No hepatosplenomegaly noted.   Genitourinary: Examination of the penis demonstrates no discharge, no masses, no lesions and a normal meatus. The scrotum is without lesions. The right epididymis is palpably normal and non-tender. The left epididymis is palpably normal and non-tender. The right testis is non-tender and without masses. The left testis is non-tender and without masses.   Lymphatics: The femoral and inguinal nodes are not enlarged or tender.   Skin: Normal skin turgor, no visible rash and no visible skin lesions.   Neuro/Psych:. Mood and affect are appropriate.    Results/Data Selected Results  UA With REFLEX [id number] 09:44AM George Gonzales, George Gonzales  SPECIMEN TYPE: CLEAN CATCH   Test Name Result Flag Reference  COLOR YELLOW  YELLOW  APPEARANCE CLEAR  CLEAR  SPECIFIC GRAVITY 1.020  1.005-1.030  pH 6.0  5.0-8.0  GLUCOSE NEG mg/dL  NEG  BILIRUBIN NEG  NEG  KETONE NEG mg/dL  NEG  BLOOD NEG  NEG  PROTEIN NEG mg/dL  NEG  UROBILINOGEN 0.2 mg/dL  0.0-1.0  NITRITE NEG  NEG  LEUKOCYTE ESTERASE NEG  NEG   AU CT-HEMATURIA PROTOCOL 21Apr2016 12:00AM Jimmey Ralph   Test Name Result Flag Reference  AU CT-HEMATURIA PROTOCOL (Report)    ** RADIOLOGY REPORT BY [facility] **  CLINICAL DATA: Two month history of episodes of gross hematuria.  EXAM: CT ABDOMEN AND PELVIS WITHOUT AND WITH CONTRAST  TECHNIQUE: Multidetector CT imaging of the abdomen and pelvis was performed following the standard protocol before and following the  bolus administration of intravenous contrast.  CONTRAST: 150 cc Isovue-300  COMPARISON: CT scan 07/20/2012  FINDINGS: Lower chest: The lung bases are clear of acute process. No pleural effusion or pulmonary lesions. The heart is normal in size. No pericardial effusion. The distal esophagus and aorta are unremarkable.  Hepatobiliary: No focal hepatic lesions or intrahepatic biliary dilatation. The gallbladder is normal. No common bile duct dilatation.  Pancreas: Normal.  Spleen: Normal.  Adrenals/Urinary Tract: The adrenal glands are normal. No renal or obstructing ureteral calculi or bladder calculi. Both kidneys demonstrate normal enhancement/ perfusion. No collecting system abnormalities are demonstrated. Both ureters are normal. The bladder is normal.  Stomach/Bowel: She stomach, duodenum, small bowel and colon are grossly normal without oral contrast. There is a large amount of stool in the rectum and sigmoid colon suggesting fecal impaction.  Vascular/Lymphatic: No mesenteric or retroperitoneal mass or adenopathy. The aorta and branch vessels are normal. The major venous structures are patent.  Other: Bladder, prostate gland and seminal vesicles are unremarkable. No pelvic mass or adenopathy. No free pelvic fluid collections. No inguinal mass or adenopathy.  Musculoskeletal: No significant bony findings.  IMPRESSION: 1. No CT findings to account for the patient's hematuria. No renal, ureteral or bladder calculi or mass. 2. No acute abdominal/pelvic findings, mass lesions or adenopathy. 3. Large amount of stool in the rectum and sigmoid colon suggesting constipation/fecal impaction.   Electronically Signed  By: Marijo Sanes M.D.  On: 01/26/2015 17:58   SICKLE CELL SCREEN 08Apr2016 09:53AM Jimmey Ralph  SPECIMEN TYPE: BLOOD   Test Name Result Flag Reference  SICKLE CELL SCREEN NEG  NEG   PSA 08Apr2016 09:53AM Jimmey Ralph  SPECIMEN TYPE: BLOOD   Test  Name Result Flag Reference  PSA 1.84 ng/mL  <=4.00  TEST METHODOLOGY: ECLIA PSA (ELECTROCHEMILUMINESCENCE IMMUNOASSAY)   Assessment Assessed  1. Gross hematuria (R31.0)  Plan Health Maintenance  1. UA With REFLEX; [Do Not Release]; Status:Complete;   Done: [id number] 09:44AM  Patient needs cystoscopy for further evaluation of hematuria. He declines to have the procedure in the office and prefers to have it under anesthesia. He understands that the findings could be negative. The risks of the procedure include but are not limited to hemorrhage, infection, urethral or bladder injury. He is willing to proceed. He will also need close follow-up of his PSA.

## 2015-02-17 NOTE — Anesthesia Procedure Notes (Signed)
Procedure Name: LMA Insertion Date/Time: 02/17/2015 11:19 AM Performed by: Wanita Chamberlain Pre-anesthesia Checklist: Patient identified, Timeout performed, Emergency Drugs available, Suction available and Patient being monitored Patient Re-evaluated:Patient Re-evaluated prior to inductionOxygen Delivery Method: Circle system utilized Preoxygenation: Pre-oxygenation with 100% oxygen Intubation Type: IV induction Ventilation: Mask ventilation without difficulty LMA: LMA inserted LMA Size: 4.0 Number of attempts: 1 Placement Confirmation: positive ETCO2 Tube secured with: Tape Dental Injury: Teeth and Oropharynx as per pre-operative assessment

## 2015-02-17 NOTE — Transfer of Care (Signed)
Immediate Anesthesia Transfer of Care Note  Patient: George Gonzales  Procedure(s) Performed: Procedure(s): CYSTOSCOPY (N/A)  Patient Location: PACU  Anesthesia Type:General  Level of Consciousness: awake, alert , oriented and patient cooperative  Airway & Oxygen Therapy: Patient Spontanous Breathing and Patient connected to nasal cannula oxygen  Post-op Assessment: Report given to RN and Post -op Vital signs reviewed and stable  Post vital signs: Reviewed and stable  Last Vitals:  Filed Vitals:   02/17/15 0959  BP:   Pulse:   Temp: 36.9 C  Resp: 12    Complications: No apparent anesthesia complications

## 2015-02-17 NOTE — Op Note (Signed)
George Gonzales is a 39 y.o.   02/17/2015  General  Preop diagnosis: Gross hematuria  Postop diagnosis: Same  Procedure done: Cystoscopy  Surgeon: Charlene Brooke. Natesha Hassey  Anesthesia: Gen.  Indication: Patient is a 39 years old male who has a history of gross hematuria on and off for the past 2-3 months. He was first seen at [facility] ER and I treated for possible UTI with cephalexin. He has also seen  blood in the semen. CT scan showed unremarkable kidneys. He needs a cystoscopy. He prefers to have it under anesthesia. He is scheduled today for the procedure.  Procedure: The patient was identified by his wrist band and proper timeout was taken.  Under general anesthesia he was prepped and draped and placed in the dorsolithotomy position. A panendoscope was inserted in the bladder. The urethra is normal. The bladder mucosa is normal. There is no stone or tumor in the bladder. The ureteral orifices are in normal position and shape with clear efflux. The bladder was distended and there was no evidence of submucosal hemorrhage. Cystoscopy was done with both the 30 and 70 lenses. The bladder was then emptied and the cystoscope removed.  The patient tolerated the procedure well and left the OR in satisfactory condition to postanesthesia care unit.  EBL:   Needles, sponges count:

## 2015-02-17 NOTE — Discharge Instructions (Signed)
CYSTOSCOPY HOME CARE INSTRUCTIONS ° °Activity: °Rest for the remainder of the day.  Do not drive or operate equipment today.  You may resume normal activities in one to two days as instructed by your physician.  ° °Meals: °Drink plenty of liquids and eat light foods such as gelatin or soup this evening.  You may return to a normal meal plan tomorrow. ° °Return to Work: °You may return to work in one to two days or as instructed by your physician. ° °Special Instructions / Symptoms: °Call your physician if any of these symptoms occur: ° ° -persistent or heavy bleeding ° -bleeding which continues after first few urination ° -large blood clots that are difficult to pass ° -urine stream diminishes or stops completely ° -fever equal to or higher than 101 degrees Farenheit. ° -cloudy urine with a strong, foul odor ° -severe pain ° ° You may feel some burning pain when you urinate.  This should disappear with time.  Applying moist heat to the lower abdomen or a hot tub bath may help relieve the pain. \ ° ° ° ° °Post Anesthesia Home Care Instructions ° °Activity: °Get plenty of rest for the remainder of the day. A responsible adult should stay with you for 24 hours following the procedure.  °For the next 24 hours, DO NOT: °-Drive a car °-Operate machinery °-Drink alcoholic beverages °-Take any medication unless instructed by your physician °-Make any legal decisions or sign important papers. ° °Meals: °Start with liquid foods such as gelatin or soup. Progress to regular foods as tolerated. Avoid greasy, spicy, heavy foods. If nausea and/or vomiting occur, drink only clear liquids until the nausea and/or vomiting subsides. Call your physician if vomiting continues. ° °Special Instructions/Symptoms: °Your throat may feel dry or sore from the anesthesia or the breathing tube placed in your throat during surgery. If this causes discomfort, gargle with warm salt water. The discomfort should disappear within 24 hours. ° °If you  had a scopolamine patch placed behind your ear for the management of post- operative nausea and/or vomiting: ° °1. The medication in the patch is effective for 72 hours, after which it should be removed.  Wrap patch in a tissue and discard in the trash. Wash hands thoroughly with soap and water. °2. You may remove the patch earlier than 72 hours if you experience unpleasant side effects which may include dry mouth, dizziness or visual disturbances. °3. Avoid touching the patch. Wash your hands with soap and water after contact with the patch. °  ° °

## 2015-02-20 ENCOUNTER — Encounter (HOSPITAL_BASED_OUTPATIENT_CLINIC_OR_DEPARTMENT_OTHER): Payer: Self-pay | Admitting: Urology

## 2015-07-09 ENCOUNTER — Observation Stay (HOSPITAL_COMMUNITY): Payer: 59

## 2015-07-09 ENCOUNTER — Observation Stay (HOSPITAL_BASED_OUTPATIENT_CLINIC_OR_DEPARTMENT_OTHER)
Admission: EM | Admit: 2015-07-09 | Discharge: 2015-07-10 | Disposition: A | Discharge status: Home / Self Care | Payer: 59 | Attending: Internal Medicine | Admitting: Internal Medicine

## 2015-07-09 ENCOUNTER — Encounter (HOSPITAL_BASED_OUTPATIENT_CLINIC_OR_DEPARTMENT_OTHER): Payer: Self-pay | Admitting: *Deleted

## 2015-07-09 ENCOUNTER — Emergency Department (HOSPITAL_BASED_OUTPATIENT_CLINIC_OR_DEPARTMENT_OTHER): Payer: 59

## 2015-07-09 DIAGNOSIS — M6289 Other specified disorders of muscle: Principal | ICD-10-CM | POA: Insufficient documentation

## 2015-07-09 DIAGNOSIS — R531 Weakness: Secondary | ICD-10-CM

## 2015-07-09 DIAGNOSIS — K219 Gastro-esophageal reflux disease without esophagitis: Secondary | ICD-10-CM | POA: Insufficient documentation

## 2015-07-09 DIAGNOSIS — H818X9 Other disorders of vestibular function, unspecified ear: Secondary | ICD-10-CM | POA: Diagnosis present

## 2015-07-09 DIAGNOSIS — I639 Cerebral infarction, unspecified: Secondary | ICD-10-CM

## 2015-07-09 DIAGNOSIS — I10 Essential (primary) hypertension: Secondary | ICD-10-CM | POA: Insufficient documentation

## 2015-07-09 LAB — CBC
HCT: 47.5 % (ref 39.0–52.0)
HCT: 45.1 % (ref 39.0–52.0)
Hemoglobin: 16.4 g/dL (ref 13.0–17.0)
Hemoglobin: 15.5 g/dL (ref 13.0–17.0)
MCH: 30.1 pg (ref 26.0–34.0)
MCH: 30.5 pg (ref 26.0–34.0)
MCHC: 34.5 g/dL (ref 30.0–36.0)
MCHC: 34.4 g/dL (ref 30.0–36.0)
MCV: 87.3 fL (ref 78.0–100.0)
MCV: 88.6 fL (ref 78.0–100.0)
PLATELETS: 259 10*3/uL (ref 150–400)
PLATELETS: 224 10*3/uL (ref 150–400)
RBC: 5.44 MIL/uL (ref 4.22–5.81)
RBC: 5.09 MIL/uL (ref 4.22–5.81)
RDW: 12.7 % (ref 11.5–15.5)
RDW: 12.6 % (ref 11.5–15.5)
WBC: 6.6 10*3/uL (ref 4.0–10.5)
WBC: 9.6 10*3/uL (ref 4.0–10.5)

## 2015-07-09 LAB — COMPREHENSIVE METABOLIC PANEL
ALT: 20 U/L (ref 17–63)
ANION GAP: 7 (ref 5–15)
AST: 24 U/L (ref 15–41)
Albumin: 4.5 g/dL (ref 3.5–5.0)
Alkaline Phosphatase: 60 U/L (ref 38–126)
BUN: 14 mg/dL (ref 6–20)
CHLORIDE: 102 mmol/L (ref 101–111)
CO2: 27 mmol/L (ref 22–32)
Calcium: 9.4 mg/dL (ref 8.9–10.3)
Creatinine, Ser: 1.13 mg/dL (ref 0.61–1.24)
Glucose, Bld: 96 mg/dL (ref 65–99)
POTASSIUM: 3.9 mmol/L (ref 3.5–5.1)
SODIUM: 136 mmol/L (ref 135–145)
Total Bilirubin: 0.7 mg/dL (ref 0.3–1.2)
Total Protein: 7.9 g/dL (ref 6.5–8.1)

## 2015-07-09 LAB — TROPONIN I

## 2015-07-09 LAB — PROTIME-INR
INR: 0.97 (ref 0.00–1.49)
PROTHROMBIN TIME: 13.1 s (ref 11.6–15.2)

## 2015-07-09 LAB — DIFFERENTIAL
BASOS PCT: 1 %
Basophils Absolute: 0 10*3/uL (ref 0.0–0.1)
EOS ABS: 0.1 10*3/uL (ref 0.0–0.7)
EOS PCT: 1 %
Lymphocytes Relative: 26 %
Lymphs Abs: 1.7 10*3/uL (ref 0.7–4.0)
MONO ABS: 0.7 10*3/uL (ref 0.1–1.0)
MONOS PCT: 11 %
NEUTROS ABS: 4.1 10*3/uL (ref 1.7–7.7)
Neutrophils Relative %: 61 %

## 2015-07-09 LAB — RAPID URINE DRUG SCREEN, HOSP PERFORMED
AMPHETAMINES: NOT DETECTED
Barbiturates: NOT DETECTED
Benzodiazepines: NOT DETECTED
COCAINE: NOT DETECTED
OPIATES: NOT DETECTED
TETRAHYDROCANNABINOL: NOT DETECTED

## 2015-07-09 LAB — CREATININE, SERUM: Creatinine, Ser: 0.96 mg/dL (ref 0.61–1.24)

## 2015-07-09 LAB — APTT: aPTT: 35 seconds (ref 24–37)

## 2015-07-09 MED ORDER — FELODIPINE ER 2.5 MG PO TB24
2.5000 mg | ORAL_TABLET | Freq: Every morning | ORAL | Status: DC
  Filled 2015-07-09: qty 1

## 2015-07-09 MED ORDER — PANTOPRAZOLE SODIUM 40 MG PO TBEC
40.0000 mg | DELAYED_RELEASE_TABLET | Freq: Every day | ORAL | Status: DC
  Administered 2015-07-09 – 2015-07-10 (×2): 40 mg via ORAL
  Filled 2015-07-09 (×2): qty 1

## 2015-07-09 MED ORDER — ACETAMINOPHEN 325 MG PO TABS
650.0000 mg | ORAL_TABLET | Freq: Four times a day (QID) | ORAL | Status: DC | PRN
  Administered 2015-07-09: 650 mg via ORAL
  Filled 2015-07-09: qty 2

## 2015-07-09 MED ORDER — IOHEXOL 350 MG/ML SOLN
100.0000 mL | Freq: Once | INTRAVENOUS | Status: AC | PRN
  Administered 2015-07-09: 100 mL via INTRAVENOUS

## 2015-07-09 MED ORDER — ASPIRIN 325 MG PO TABS
325.0000 mg | ORAL_TABLET | Freq: Every day | ORAL | Status: DC
  Administered 2015-07-09 – 2015-07-10 (×2): 325 mg via ORAL
  Filled 2015-07-09 (×2): qty 1

## 2015-07-09 MED ORDER — ENOXAPARIN SODIUM 40 MG/0.4ML ~~LOC~~ SOLN
40.0000 mg | SUBCUTANEOUS | Status: DC
  Administered 2015-07-09: 40 mg via SUBCUTANEOUS
  Filled 2015-07-09: qty 0.4

## 2015-07-09 MED ORDER — SODIUM CHLORIDE 0.9 % IV SOLN
INTRAVENOUS | Status: DC
  Administered 2015-07-09: 1 mL via INTRAVENOUS

## 2015-07-09 MED ORDER — SENNOSIDES-DOCUSATE SODIUM 8.6-50 MG PO TABS: 1.0000 | ORAL_TABLET | Freq: Every evening | ORAL | Status: DC | PRN

## 2015-07-09 MED ORDER — ASPIRIN 300 MG RE SUPP
300.0000 mg | Freq: Every day | RECTAL | Status: DC
  Filled 2015-07-09 (×2): qty 1

## 2015-07-09 MED ORDER — METOCLOPRAMIDE HCL 5 MG/ML IJ SOLN
10.0000 mg | Freq: Once | INTRAMUSCULAR | Status: AC
  Administered 2015-07-09: 10 mg via INTRAVENOUS
  Filled 2015-07-09: qty 2

## 2015-07-09 MED ORDER — STROKE: EARLY STAGES OF RECOVERY BOOK
Freq: Once | Status: DC
  Filled 2015-07-09: qty 1

## 2015-07-09 NOTE — ED Notes (Signed)
[facility] at bedside. Report given to [place] with Karen Chafe and Lauren at [facility] on [address]. Pt's mother has taken all of his personal affects and was given directions to [facility] and department.

## 2015-07-09 NOTE — H&P (Signed)
Patient Demographics  George Gonzales, is a 39 y.o. male  MRN: [id number]   DOB - [birth year]  Admit Date - 07/09/2015  Outpatient Primary MD for the patient is George Fendt, MD   With History of -  Past Medical History  Diagnosis Date  . Raynaud's syndrome   . Hypertension   . GERD (gastroesophageal reflux disease)   . History of MI (myocardial infarction)     pt states at age 56 had Stress-induced heart attack/  also states had myoview Jan 2016 w/ PCP and it was normal  . History of chest pain   . History of chronic bronchitis   . Prediabetes   . Lower urinary tract symptoms (LUTS)   . Gross hematuria   . History of kidney stones   . History of primary genital syphilis     1997-- treated w/ penicillin injection's      Past Surgical History  Procedure Laterality Date  . No past surgeries    . Cystoscopy N/A 02/17/2015    Procedure: CYSTOSCOPY;  Surgeon: Lowella Bandy, MD;  Location: [facility];  Service: Urology;  Laterality: N/A;    in for   Chief Complaint  Patient presents with  . Extremity Weakness     HPI  George Gonzales  is a 39 y.o. male, with past medical history of hypertension and GERD, but at his usual state of health until yesterday night, patient reports sudden onset of right side tingling numbness, followed by weakness, reports he went to bed, when he woke up his symptoms did not resolve, which prompted him to ask for medical attention, patient presented to [facility], CT head with no acute finding CT angiogram of the head with no medium or large vessel disease or occlusion.hospitalist requested to admit the patient for further workup .   Review of Systems    In addition to the HPI above,  No Fever-chills, No Headache, No changes with Vision or hearing, No problems swallowing food or Liquids, No Chest pain, Cough or Shortness of Breath, No Abdominal pain, No Nausea or Vommitting, Bowel movements are regular, No Blood in stool or Urine, No  dysuria, No new skin rashes or bruises, No new joints pains-aches,  Patient reports right-sided weakness. No recent weight gain or loss, No polyuria, polydypsia or polyphagia, Recent mental stressors at work  A full 10 point Review of Systems was done, except as stated above, all other Review of Systems were negative.   Social History Social History  Substance Use Topics  . Smoking status: Former Smoker -- 0.25 packs/day for 11 years    Types: Cigarettes    Quit date: 02/16/2000  . Smokeless tobacco: Never Used  . Alcohol Use: Yes     Comment: 1 beer/week     Family History family members with stroke and heart attack prior to age  45   Prior to Admission medications   Medication Sig Start Date End Date Taking? Authorizing Provider  felodipine (PLENDIL) 2.5 MG 24 hr tablet Take 2.5 mg by mouth every morning.   Yes Historical Provider, MD  omeprazole (PRILOSEC) 40 MG capsule Take 40 mg by mouth every morning.   Yes Historical Provider, MD  acetaminophen (TYLENOL) 500 MG tablet Take 1,000 mg by mouth every 6 (six) hours as needed.    Historical Provider, MD  HYDROcodone-acetaminophen (NORCO/VICODIN) 5-325 MG per tablet Take 1 tablet by mouth every 6 (six) hours as needed for moderate pain.    Historical  Provider, MD  Multiple Vitamin (MULTIVITAMIN) tablet Take 1 tablet by mouth daily.    Historical Provider, MD    No Known Allergies  Physical Exam  Vitals  Blood pressure 115/71, pulse 72, temperature 98.2 F (36.8 C), temperature source Oral, resp. rate 20, height 5\' 10"  (1.778 m), weight 68.947 kg (152 lb), SpO2 100 %.   1. General young male lying in bed in NAD,    2. Normal affect and insight, Not Suicidal or Homicidal, Awake Alert, Oriented X 3.  3. neurological exam is inconsistent and 4 extremities, overall more weakness in the right upper and lower extremity, but is inconsistent as mentioned,  sometimes exhibits full strength to stop propofol the fourth for a  brief period of time .  4.ears and Eyes appear Normal, Conjunctivae clear, PERRLA. Moist Oral Mucosa.  5. Supple Neck, No JVD, No cervical lymphadenopathy appriciated, No Carotid Bruits.  6. Symmetrical Chest wall movement, Good air movement bilaterally, CTAB.  7. RRR, No Gallops, Rubs or Murmurs, No Parasternal Heave.  8. Positive Bowel Sounds, Abdomen Soft, No tenderness, No organomegaly appriciated,No rebound -guarding or rigidity.  9.  No Cyanosis, Normal Skin Turgor, No Skin Rash or Bruise.  10. Good muscle tone,  joints appear normal , no effusions, Normal ROM.  11. No Palpable Lymph Nodes in Neck or Axillae    Data Review  CBC  Recent Labs Lab 07/09/15 1140  WBC 6.6  HGB 16.4  HCT 47.5  PLT 259  MCV 87.3  MCH 30.1  MCHC 34.5  RDW 12.7  LYMPHSABS 1.7  MONOABS 0.7  EOSABS 0.1  BASOSABS 0.0   ------------------------------------------------------------------------------------------------------------------  Chemistries   Recent Labs Lab 07/09/15 1140  NA 136  K 3.9  CL 102  CO2 27  GLUCOSE 96  BUN 14  CREATININE 1.13  CALCIUM 9.4  AST 24  ALT 20  ALKPHOS 60  BILITOT 0.7   ------------------------------------------------------------------------------------------------------------------ estimated creatinine clearance is 87.2 mL/min (by C-G formula based on Cr of 1.13). ------------------------------------------------------------------------------------------------------------------ No results for input(s): TSH, T4TOTAL, T3FREE, THYROIDAB in the last 72 hours.  Invalid input(s): FREET3   Coagulation profile  Recent Labs Lab 07/09/15 1140  INR 0.97   ------------------------------------------------------------------------------------------------------------------- No results for input(s): DDIMER in the last 72 hours. -------------------------------------------------------------------------------------------------------------------  Cardiac  Enzymes  Recent Labs Lab 07/09/15 1140  TROPONINI <0.03   ------------------------------------------------------------------------------------------------------------------ Invalid input(s): POCBNP   ---------------------------------------------------------------------------------------------------------------  Urinalysis    Component Value Date/Time   COLORURINE YELLOW 12/19/2014 2000   APPEARANCEUR CLEAR 12/19/2014 2000   LABSPEC 1.022 12/19/2014 2000   PHURINE 6.0 12/19/2014 2000   GLUCOSEU NEGATIVE 12/19/2014 2000   HGBUR NEGATIVE 12/19/2014 2000   HGBUR negative 09/22/2008 1457   BILIRUBINUR NEGATIVE 12/19/2014 2000   KETONESUR NEGATIVE 12/19/2014 2000   PROTEINUR NEGATIVE 12/19/2014 2000   UROBILINOGEN 1.0 12/19/2014 2000   NITRITE NEGATIVE 12/19/2014 2000   LEUKOCYTESUR NEGATIVE 12/19/2014 2000    ----------------------------------------------------------------------------------------------------------------  Imaging results:   Ct Angio Head W/cm &/or Wo Cm  07/09/2015   CLINICAL DATA:  39 year old hypertensive pre diabetic male with history of Raynaud's syndrome and myocardial infarction presenting with right upper and lower extremity numbness and weakness for 1 day. Subsequent encounter.  EXAM: CT ANGIOGRAPHY HEAD  TECHNIQUE: Multidetector CT imaging of the head was performed using the standard protocol during bolus administration of intravenous contrast. Multiplanar CT image reconstructions and MIPs were obtained to evaluate the vascular anatomy.  CONTRAST:  120mL OMNIPAQUE IOHEXOL 350 MG/ML SOLN  COMPARISON:  07/09/2015 and  01/21/2004 head CT.  FINDINGS: CT HEAD  Brain: No intracranial hemorrhage. No CT evidence of large acute infarct. No intracranial mass or abnormal enhancement. No hydrocephalus.  Calvarium and skull base: Negative.  Paranasal sinuses: Clear.  Orbits: Negative.  CTA HEAD  Anterior circulation: Anterior circulation without medium or large size vessel  significant stenosis or occlusion. Fetal contribution to the posterior cerebral arteries. 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  Posterior circulation: No significant narrowing medium or large size vessels.  Venous sinuses: Negative  Anatomic variants: Negative  Delayed phase:Negative  IMPRESSION: Anterior and posterior circulation without medium or large size vessel significant stenosis or occlusion.  Fetal contribution to the posterior cerebral arteries.  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  No intracranial hemorrhage.  No CT evidence of large acute infarct.  No intracranial mass or abnormal enhancement.   Electronically Signed   By: Genia Del M.D.   On: 07/09/2015 13:11   Ct Head Wo Contrast  07/09/2015   CLINICAL DATA:  39 year old male with acute right upper and lower extremity numbness and weakness for 1 day.  EXAM: CT HEAD WITHOUT CONTRAST  TECHNIQUE: Contiguous axial images were obtained from the base of the skull through the vertex without intravenous contrast.  COMPARISON:  01/21/2004 head CT  FINDINGS: No intracranial abnormalities are identified, including mass lesion or mass effect, hydrocephalus, extra-axial fluid collection, midline shift, hemorrhage, or acute infarction.  The visualized bony calvarium is unremarkable.  IMPRESSION: Unremarkable noncontrast head CT.   Electronically Signed   By: Margarette Canada M.D.   On: 07/09/2015 12:18    My personal review of EKG: Rhythm NSR, Rate  93 /min, QTc 422 , no Acute ST changes    Assessment & Plan  Active Problems:   Unilateral vestibular weakness   Right sided weakness   HTN (hypertension)   GERD (gastroesophageal reflux disease)  Right sided weakness - CT head, CT head angiogram with no acute finding, physical exam is inconsistent, patient will be admitted to telemetry unit for further workup, will continue with telemetry monitoring,  will obtain MRI brain, MRI cervical spine, as per discussion with neurology service, started on aspirin pending his workup. - if MRI brain is negative, I would consider stress mainly contributing to his symptoms. - PT/OT consult.   Hypertension - Blood pressure is acceptable, continue with home medication  GERD - Continue with PPI   DVT Prophylaxis   Lovenox  AM Labs Ordered, also please review Full Orders  Family Communication: Admission, patients condition and plan of care including tests being ordered have been discussed with the patient who indicate understanding and agree with the plan and Code Status.  Code Status full  Likely DC to  home  Condition GUARDED   Time spent in minutes : 55 minutes    Kaan Tosh M.D on 07/09/2015 at 5:40 PM  Between 7am to 7pm - Pager - [phone]  After 7pm go to [email or web address] - password [email or web address]  And look for the night coverage person covering me after hours  [facility] Office  [phone]

## 2015-07-09 NOTE — Consult Note (Signed)
Neurology Consultation Reason for Consult: Right-sided weakness Referring Physician: Elgergawy, D  CC: Right-sided weakness  History is obtained from: Patient  George Gonzales is a 39 y.o. male states that he was in his normal state of health up until last night when he had sudden onset of right-sided weakness and numbness. He states that he then lay down and found he was unable to speak and therefore couldn't ask for help. This morning he was able to speak and was able to ask for help and therefore was assisted into [facility] point.  Due to the right-sided weakness, he has been admitted for further evaluation. He is also complaining of right-sided head pain and neck pain.  He is under significant amount of stress given that he just had hours cut back at his job and is currently living in a hotel while his home is rebuilt.  LKW: 10/1 tpa given?: no, outside of window    ROS: A 14 point ROS was performed and is negative except as noted in the HPI.   Past Medical History  Diagnosis Date  . Raynaud's syndrome   . Hypertension   . GERD (gastroesophageal reflux disease)   . History of MI (myocardial infarction)     pt states at age 12 had Stress-induced heart attack/  also states had myoview Jan 2016 w/ PCP and it was normal  . History of chest pain   . History of chronic bronchitis   . Prediabetes   . Lower urinary tract symptoms (LUTS)   . Gross hematuria   . History of kidney stones   . History of primary genital syphilis     1997-- treated w/ penicillin injection's     Family history: Multiple family members with stroke or heart attack prior to 69  Social History:  reports that he quit smoking about 15 years ago. His smoking use included Cigarettes. He has a 2.75 pack-year smoking history. He has never used smokeless tobacco. He reports that he drinks alcohol. He reports that he does not use illicit drugs.   Exam: Current vital signs: BP 115/71 mmHg  Pulse 72   Temp(Src) 98.2 F (36.8 C) (Oral)  Resp 20  Ht 5\' 10"  (1.778 m)  Wt 68.947 kg (152 lb)  BMI 21.81 kg/m2  SpO2 100% Vital signs in last 24 hours: Temp:  [98.2 F (36.8 C)-98.5 F (36.9 C)] 98.2 F (36.8 C) (10/02 1401) Pulse Rate:  [59-85] 72 (10/02 1515) Resp:  [10-28] 20 (10/02 1530) BP: (91-134)/(56-85) 115/71 mmHg (10/02 1530) SpO2:  [100 %] 100 % (10/02 1515) Weight:  [68.947 kg (152 lb)] 68.947 kg (152 lb) (10/02 1112)  Physical Exam  Constitutional: Appears well-developed and well-nourished.  Psych: Affect appropriate to situation Eyes: No scleral injection HENT: No OP obstrucion Head: Normocephalic.  Cardiovascular: Normal rate and regular rhythm.  Respiratory: Effort normal and breath sounds normal to anterior ascultation GI: Soft.  No distension. There is no tenderness.  Skin: WDI  Neuro: Mental Status: Patient is awake, alert, oriented to person, place, month, year, and situation. Patient is able to give a clear and coherent history. No signs of aphasia or neglect Cranial Nerves: II: Visual Fields are full. Pupils are equal, round, and reactive to light.   III,IV, VI: EOMI without ptosis or diploplia.  V: Facial sensation is symmetric to temperature VII: Facial movement is symmetric, though he does not smile very well and kind of purses his lips instead of smiling, he has equal  nasolabial folds VIII: hearing is intact to voice X: Uvula elevates symmetrically XI: Shoulder shrug is symmetric. XII: tongue is midline without atrophy or fasciculations.  Motor: Tone is normal. Bulk is normal. He has markedly inconsistent strength exam on the right with no movement initially when asked to however, and I start him doing finger-nose-finger and say continue this he is able to do so on the right slowly. Similar findings in the leg. Sensory: Sensation is diminished on the right side Deep Tendon Reflexes: 2+ and symmetric in the biceps and patellae.  Cerebellar: FNF  intact bilaterally  I have reviewed labs in epic and the results pertinent to this consultation are: CMP-unremarkable  I have reviewed the images obtained: CT head-unremarkable  Impression: 39 year old male with right-sided weakness with inconsistent exam. Given his neck pain, would favor doing an MRI of his brain and cervical spine to rule out pathology. With the duration of symptoms, if his MRI is negative for stroke, then I do not think that TIA would be possibly even if his symptoms resolved at this point. I think there is a significant chance that this could be psychogenic in nature, but is a diagnosis of exclusion and like to investigate other possibilities first.  Recommendations: 1) MRI brain and cervical spine 2) Reglan 10 mg for headache 3) no stroke workup in last MRI is positive.   Roland Rack, MD [facility] [phone]  If 7pm- 7am, please page neurology on call as listed in [place].

## 2015-07-09 NOTE — ED Provider Notes (Signed)
CSN: [id number]     Arrival date & time 07/09/15  1058 History   First MD Initiated Contact with Patient 07/09/15 1159     Chief Complaint  Patient presents with  . Extremity Weakness     (Consider location/radiation/quality/duration/timing/severity/associated sxs/prior Treatment) HPI Comments: Pt is a 39 yo male who presents to the ED with complaint of unilateral weakness, onset 10pm last night. Pt reports sudden onset of right sided weakness to his left arm and leg while laying down watching tv. He reports trying to rest after onset and notes he woke up this morning with continued weakness. Endorses tingling to his right arm and leg and right neck pain. He also states he feels like he is having difficulty speaking. Denies fever, chills, neck stiffness, HA, visual changes, sore throat, cough, SOB, CP, palpitations, abdominal pain, N/V/D, urinary sxs, rash, dizziness, lightheaded, rash. Denies any facial weakness.  Patient is a 39 y.o. male presenting with extremity weakness.  Extremity Weakness Associated symptoms include numbness and weakness.    Past Medical History  Diagnosis Date  . Raynaud's syndrome   . Hypertension   . GERD (gastroesophageal reflux disease)   . History of MI (myocardial infarction)     pt states at age 68 had Stress-induced heart attack/  also states had myoview Jan 2016 w/ PCP and it was normal  . History of chest pain   . History of chronic bronchitis   . Prediabetes   . Lower urinary tract symptoms (LUTS)   . Gross hematuria   . History of kidney stones   . History of primary genital syphilis     1997-- treated w/ penicillin injection's   Past Surgical History  Procedure Laterality Date  . No past surgeries    . Cystoscopy N/A 02/17/2015    Procedure: CYSTOSCOPY;  Surgeon: Lowella Bandy, MD;  Location: [facility];  Service: Urology;  Laterality: N/A;   No family history on file. Social History  Substance Use Topics  . Smoking status:  Former Smoker -- 0.25 packs/day for 11 years    Types: Cigarettes    Quit date: 02/16/2000  . Smokeless tobacco: Never Used  . Alcohol Use: Yes     Comment: 1 beer/week    Review of Systems  Musculoskeletal: Positive for extremity weakness.  Neurological: Positive for weakness and numbness.  All other systems reviewed and are negative.     Allergies  Review of patient's allergies indicates no known allergies.  Home Medications   Prior to Admission medications   Medication Sig Start Date End Date Taking? Authorizing Provider  felodipine (PLENDIL) 2.5 MG 24 hr tablet Take 2.5 mg by mouth every morning.   Yes Historical Provider, MD  omeprazole (PRILOSEC) 40 MG capsule Take 40 mg by mouth every morning.   Yes Historical Provider, MD  acetaminophen (TYLENOL) 500 MG tablet Take 1,000 mg by mouth every 6 (six) hours as needed.    Historical Provider, MD  HYDROcodone-acetaminophen (NORCO/VICODIN) 5-325 MG per tablet Take 1 tablet by mouth every 6 (six) hours as needed for moderate pain.    Historical Provider, MD  Multiple Vitamin (MULTIVITAMIN) tablet Take 1 tablet by mouth daily.    Historical Provider, MD   BP 118/75 mmHg  Pulse 67  Temp(Src) 98.2 F (36.8 C) (Oral)  Resp 13  Ht 5\' 10"  (1.778 m)  Wt 152 lb (68.947 kg)  BMI 21.81 kg/m2  SpO2 100% Physical Exam  Constitutional: He is oriented to person, place, and  time. He appears well-developed and well-nourished. No distress.  HENT:  Head: Normocephalic and atraumatic.  Mouth/Throat: Oropharynx is clear and moist. No oropharyngeal exudate.  Eyes: Conjunctivae and EOM are normal. Pupils are equal, round, and reactive to light. Right eye exhibits no discharge. Left eye exhibits no discharge. No scleral icterus.  Neck: Neck supple. Carotid bruit is not present.  Dec. ROM of neck due to weakness. Full passive ROM of neck without pain.   Cardiovascular: Normal rate, regular rhythm, normal heart sounds and intact distal pulses.    No murmur heard. Pulmonary/Chest: Effort normal and breath sounds normal. He has no wheezes. He has no rales. He exhibits no tenderness.  Abdominal: Soft. Bowel sounds are normal. He exhibits no mass. There is no tenderness. There is no rebound and no guarding.  Musculoskeletal: He exhibits no edema or tenderness.  Dec. Active ROM of right arm and leg, 3/5 strength of right arm and leg. Decreased grip strength on right side, pt able to move fingers but unable to make a fist. 5/5 strength to left arm and leg. 2+ peripheral pulses. Sensation intact.   Lymphadenopathy:    He has no cervical adenopathy.  Neurological: He is alert and oriented to person, place, and time. No sensory deficit.  CN 2-10 exam revealed bilateral weakness noted throughout exam. No aphasia or speech abnormalities.  Skin: Skin is warm and dry. He is not diaphoretic.  Nursing note and vitals reviewed.   ED Course  Procedures (including critical care time) Labs Review Labs Reviewed  PROTIME-INR  APTT  CBC  DIFFERENTIAL  COMPREHENSIVE METABOLIC PANEL  TROPONIN I    Imaging Review Ct Head Wo Contrast  07/09/2015   CLINICAL DATA:  39 year old male with acute right upper and lower extremity numbness and weakness for 1 day.  EXAM: CT HEAD WITHOUT CONTRAST  TECHNIQUE: Contiguous axial images were obtained from the base of the skull through the vertex without intravenous contrast.  COMPARISON:  01/21/2004 head CT  FINDINGS: No intracranial abnormalities are identified, including mass lesion or mass effect, hydrocephalus, extra-axial fluid collection, midline shift, hemorrhage, or acute infarction.  The visualized bony calvarium is unremarkable.  IMPRESSION: Unremarkable noncontrast head CT.   Electronically Signed   By: Margarette Canada M.D.   On: 07/09/2015 12:18   I have personally reviewed and evaluated these images and lab results as part of my medical decision-making.   EKG Interpretation None      Filed Vitals:    07/10/15 0757  BP: 119/77  Pulse: 76  Temp: 98 F (36.7 C)  Resp: 17    MDM   Final diagnoses:  Weakness    Pt presents with sudden onset weakness to his right arm and leg that occurred last night. Denies fever, chills, headache, visual changes, SOB, CP, abdominal pain, N/V/D, rash, lightheaded, dizzy. VSS. A&Ox3. Exam revealed dec. Active ROM of right arm and leg, 3/5 strength (5/5 strength on left side), dec. Grip strength of right hand (pt only able to barely wiggle right fingers). 2+ distal pulses. CN exam revealed bilateral weakness. NO aphasia. Lab work unremarkable. UDS negative. CT head w/o and angio negative. At this time it is still unclear what the cause of pt's weakness, he will likely need a MRI. Will consult neuro.  Pt remains hemodynamically stable.   1:56 - Consulted neurology, Dr. Leonel Ramsay recommends further workup to include MRI. Will consult hospitalist for admission, Dr. Carles Collet agrees to admit. Orders place for tele bed. Discussed results and plan  for admission with pt.   Chesley Noon [place], [place] 07/10/15 1248  Merrily Pew, MD 07/10/15 1504

## 2015-07-09 NOTE — Progress Notes (Signed)
39 year old male with a history of hypertension, GERD presented with weakness of the right upper extremity and right lower extremity that began at 10 PM on 07/08/2015. The patient went to bed and woke up with the same symptoms but began complaining of tingling on the right upper extremity and right lower extremity. He had denied any dysarthria, or visual disturbance. The patient was afebrile and hemodynamically stable. CMP and CBC were unremarkable. Urine drug screen was negative. EKG shows sinus rhythm without ST-T wave changes. CT of the brain was negative for acute findings. CT angiogram of the head was negative for infarction or hemodynamically significant stenosis. Neurology was contacted by the emergency department and recommended admission for full stroke workup.  The patient was accepted for a telemetry bed.  DTat

## 2015-07-09 NOTE — Progress Notes (Signed)
Patient asking for something to eat.  Says he was told he could have a regular diet if he passed stroke swallow screen.  He passed screening and swallowed pills easily. Family member upset that patient has not had something to eat and has no diet order.   Paged on call for [facility].

## 2015-07-09 NOTE — Progress Notes (Signed)
Pt arrived on the unit.   George Gonzales

## 2015-07-09 NOTE — ED Notes (Signed)
Pt reports sudden onset of right side pain and weakness last night between 2200-2230 while watching tv. States he laid done to see if it would go away. Woke this am with same Sx. Reports feeling tingling in right arm. Pain in right side of neck. States right leg "feels numb"- Grip absent on right side. Speech clear. Roselyn Reef, Charge nurse notified of pt's status

## 2015-07-10 DIAGNOSIS — H8121 Vestibular neuronitis, right ear: Secondary | ICD-10-CM

## 2015-07-10 DIAGNOSIS — M6289 Other specified disorders of muscle: Secondary | ICD-10-CM | POA: Diagnosis not present

## 2015-07-10 LAB — LIPID PANEL
CHOL/HDL RATIO: 3.4 ratio
Cholesterol: 139 mg/dL (ref 0–200)
HDL: 41 mg/dL (ref >40)
LDL CALC: 90 mg/dL (ref 0–99)
Triglycerides: 39 mg/dL (ref <150)
VLDL: 8 mg/dL (ref 0–40)

## 2015-07-10 MED ORDER — SENNOSIDES-DOCUSATE SODIUM 8.6-50 MG PO TABS: 1.0000 | ORAL_TABLET | Freq: Every evening | ORAL | Status: DC | PRN

## 2015-07-10 MED ORDER — CYCLOBENZAPRINE HCL 5 MG PO TABS: 5.0000 mg | ORAL_TABLET | Freq: Three times a day (TID) | ORAL | Status: DC | PRN

## 2015-07-10 MED ORDER — TRAMADOL HCL 50 MG PO TABS: 50.0000 mg | ORAL_TABLET | Freq: Four times a day (QID) | ORAL | Status: DC | PRN

## 2015-07-10 NOTE — Evaluation (Signed)
Occupational Therapy Evaluation & Discharge Patient Details Name: George Gonzales MRN: [id number] DOB: [birth year] Today's Date: 07/10/2015    History of Present Illness 39 y.o. male presenting with Rt sided tingling, numbness and weakness. PMH: Raynaud's syndrome, HTN, MI, Gross hematuria   Clinical Impression   Pt admitted to hospital due to reason stated above. Pt currently with functional limitiations due to the deficits listed below (see OT problem list). Prior to admission pt was driving and independent with ADLs/IADLs. Pt currently requires supervision intermittently for safety with ADLs. Pt reported tingling on dorsal aspect of Rt foot. Pt performed DGI with good results, scoring 24/24. Pt provided with handout regarding signs/symptoms of stroke. Pt provided with therapy hand ball and hand exercises to increase strength. At this time no further acute OT needs required, however pt would benefit form supervision PRN to increase safety with ADLs.  Follow Up Recommendations  Supervision - Intermittent    Equipment Recommendations  None recommended by OT    Recommendations for Other Services       Precautions / Restrictions Precautions Precautions: Fall Restrictions Weight Bearing Restrictions: No      Mobility Bed Mobility Overal bed mobility: Needs Assistance Bed Mobility: Supine to Sit     Supine to sit: Supervision        Transfers Overall transfer level: Needs assistance Equipment used: None Transfers: Sit to/from Stand Sit to Stand: Supervision              Balance Overall balance assessment: Needs assistance Sitting-balance support: No upper extremity supported;Feet supported Sitting balance-Leahy Scale: Good     Standing balance support: No upper extremity supported;During functional activity Standing balance-Leahy Scale: Good Standing balance comment: Pt able to stand at sink to perform grooming task without UE support                  Standardized Balance Assessment Standardized Balance Assessment : Dynamic Gait Index   Dynamic Gait Index Level Surface: Normal Change in Gait Speed: Normal Gait with Horizontal Head Turns: Normal Gait with Vertical Head Turns: Normal Gait and Pivot Turn: Normal Step Over Obstacle: Normal Step Around Obstacles: Normal Steps: Normal Total Score: 24      ADL Overall ADL's : Needs assistance/impaired Eating/Feeding: Independent   Grooming: Oral care;Supervision/safety;Standing   Upper Body Bathing: Supervision/ safety;Standing   Lower Body Bathing: Supervison/ safety;Sit to/from stand   Upper Body Dressing : Supervision/safety;Standing   Lower Body Dressing: Supervision/safety;Sit to/from stand   Toilet Transfer: Supervision/safety;Ambulation;Comfort height toilet   Toileting- Clothing Manipulation and Hygiene: Supervision/safety;Sit to/from stand   Tub/ Shower Transfer: Tub transfer;Ambulation   Functional mobility during ADLs: Supervision/safety General ADL Comments: Pt at baseline for ADLs     Vision     Perception     Praxis      Pertinent Vitals/Pain Pain Assessment: No/denies pain Pain Score: 0-No pain     Hand Dominance Right   Extremity/Trunk Assessment Upper Extremity Assessment Upper Extremity Assessment: Overall WFL for tasks assessed   Lower Extremity Assessment Lower Extremity Assessment: Defer to PT evaluation   Cervical / Trunk Assessment Cervical / Trunk Assessment: Normal   Communication Communication Communication: No difficulties   Cognition Arousal/Alertness: Awake/alert Behavior During Therapy: WFL for tasks assessed/performed Overall Cognitive Status: Within Functional Limits for tasks assessed                     General Comments       Exercises  Shoulder Instructions      Home Living Family/patient expects to be discharged to:: Private residence Living Arrangements: Non-relatives/Friends (Pt lives in  and extended stay hotel until home is built) Available Help at Discharge: Friend(s);Available PRN/intermittently Type of Home: Other(Comment) Home Access: Elevator     Home Layout: One level     Bathroom Shower/Tub: Tub/shower unit Shower/tub characteristics: Architectural technologist: Standard Bathroom Accessibility: Yes   Home Equipment: None   Additional Comments: Pt currently drives      Prior Functioning/Environment Level of Independence: Independent             OT Diagnosis: Generalized weakness   OT Problem List: Decreased strength;Decreased safety awareness;Impaired sensation   OT Treatment/Interventions:      OT Goals(Current goals can be found in the care plan section) Acute Rehab OT Goals Patient Stated Goal: to return home and to work OT Goal Formulation: With patient  OT Frequency:     Barriers to D/C:            Co-evaluation              End of Session Equipment Utilized During Treatment: Gait belt Nurse Communication: Other (comment) (handout for stroke)  Activity Tolerance: Patient tolerated treatment well Patient left: in chair;with call bell/phone within reach   Time: 0823-0903 OT Time Calculation (min): 40 min Charges:  OT General Charges $OT Visit: 1 Procedure OT Evaluation $Initial OT Evaluation Tier I: 1 Procedure OT Treatments $Self Care/Home Management : 23-37 mins G-Codes: OT G-codes **NOT FOR INPATIENT CLASS** Functional Assessment Tool Used: clinical reasoning Functional Limitation: Self care Self Care Current Status (N4076): 0 percent impaired, limited or restricted Self Care Goal Status (K0881): 0 percent impaired, limited or restricted Self Care Discharge Status (J0315): 0 percent impaired, limited or restricted  Lin Landsman 07/10/2015, 10:48 AM

## 2015-07-10 NOTE — Progress Notes (Signed)
Pt discharge instructions and prescriptions given, pt verbalized understanding.  VSS. Denies pain. Pt left floor ambulating accompanied by family.

## 2015-07-10 NOTE — Evaluation (Signed)
Physical Therapy Evaluation and D/C Patient Details Name: George Gonzales MRN: [id number] DOB: [birth year] Today's Date: 07/10/2015   History of Present Illness  39 y.o. male presenting with Rt sided tingling, numbness and weakness. PMH: Raynaud's syndrome, HTN, MI, Gross hematuria  Clinical Impression  Pt admitted with above diagnosis. Pt currently without significant functional limitations with pt able to ambulate without LOB without device.  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  Also gave pt educational handouts regarding risk factors for TIA/Stroke as well as warning signs for TIA/stroke.  Pt will follow up with skilled PT only if symptoms persist at home.    Will sign off.      Follow Up Recommendations No PT follow up    Equipment Recommendations  None recommended by PT    Recommendations for Other Services       Precautions / Restrictions Precautions Precautions: Fall Restrictions Weight Bearing Restrictions: No      Mobility  Bed Mobility Overal bed mobility: Independent Bed Mobility: Supine to Sit     Supine to sit: Supervision        Transfers Overall transfer level: Independent Equipment used: None Transfers: Sit to/from Stand Sit to Stand: Supervision            Ambulation/Gait Ambulation/Gait assistance: Supervision Ambulation Distance (Feet): 150 Feet Assistive device: None       General Gait Details: No LOB with challenges.   Stairs Stairs:  (performed with OT.)          Wheelchair Mobility    Modified Rankin (Stroke Patients Only) Modified Rankin (Stroke Patients Only) Pre-Morbid Rankin Score: No symptoms Modified Rankin: Slight disability     Balance Overall balance assessment: Needs assistance Sitting-balance support: No upper extremity supported;Feet supported Sitting balance-Leahy Scale: Good     Standing balance support: No upper extremity  supported;During functional activity Standing balance-Leahy Scale: Good Standing balance comment: Can withstand challenges to balance without physical assist.              High level balance activites: Direction changes;Turns;Sudden stops High Level Balance Comments: can perform without assist.  Pt can stoop to floor and pick up object and maintain balance as well.   Standardized Balance Assessment Standardized Balance Assessment : Dynamic Gait Index   Dynamic Gait Index Level Surface: Normal Change in Gait Speed: Normal Gait with Horizontal Head Turns: Normal Gait with Vertical Head Turns: Normal Gait and Pivot Turn: Normal Step Over Obstacle: Normal Step Around Obstacles: Normal Steps: Normal Total Score: 24       Pertinent Vitals/Pain Pain Assessment: No/denies pain Pain Score: 0-No pain  VSS    Home Living Family/patient expects to be discharged to:: Private residence Living Arrangements: Non-relatives/Friends (Pt lives in and extended stay hotel until home is built) Available Help at Discharge: Friend(s);Available PRN/intermittently Type of Home: Other(Comment) Home Access: Elevator     Home Layout: One level Home Equipment: None Additional Comments: Pt currently drives    Prior Function Level of Independence: Independent               Hand Dominance   Dominant Hand: Right    Extremity/Trunk Assessment   Upper Extremity Assessment: Defer to OT evaluation           Lower Extremity Assessment: RLE deficits/detail RLE Deficits / Details: grossly 3-/5    Cervical / Trunk Assessment: Normal  Communication   Communication: No difficulties  Cognition Arousal/Alertness:  Awake/alert Behavior During Therapy: WFL for tasks assessed/performed Overall Cognitive Status: Within Functional Limits for tasks assessed                      General Comments General comments (skin integrity, edema, etc.): Pt tested negative for BPPV all canals.   Pt tested positive for right vestibular hypofunction per head thrust.      Exercises Other Exercises Other Exercises: Initiated x1 exercises with pt able to perform in sitting and standing with feet apart.  Did cause some symptoms of dizziness therefore instructed pt to perform side to side and up and down several x day.   Gave pt full progression of exercises to include standing feet tandem, marching in place and walking.  Pt verbalizes understanding of progression.  Pt informed that if dizziness persists he can ask MD to consult Outpt PT for pt.        Assessment/Plan    PT Assessment Patent does not need any further PT services  PT Diagnosis Generalized weakness   PT Problem List    PT Treatment Interventions     PT Goals (Current goals can be found in the Care Plan section) Acute Rehab PT Goals Patient Stated Goal: to return home and to work PT Goal Formulation: All assessment and education complete, DC therapy    Frequency     Barriers to discharge        Co-evaluation               End of Session Equipment Utilized During Treatment: Gait belt Activity Tolerance: Patient tolerated treatment well Patient left: in chair;with call bell/phone within reach Nurse Communication: Mobility status    Functional Assessment Tool Used: clinical judgement Functional Limitation: Mobility: Walking and moving around Mobility: Walking and Moving Around Current Status (F3545): At least 1 percent but less than 20 percent impaired, limited or restricted Mobility: Walking and Moving Around Goal Status [phone]): At least 1 percent but less than 20 percent impaired, limited or restricted Mobility: Walking and Moving Around Discharge Status [phone]): At least 1 percent but less than 20 percent impaired, limited or restricted    Time: 1009-1025 PT Time Calculation (min) (ACUTE ONLY): 16 min   Charges:   PT Evaluation $Initial PT Evaluation Tier I: 1 Procedure     PT G Codes:   PT  G-Codes **NOT FOR INPATIENT CLASS** Functional Assessment Tool Used: clinical judgement Functional Limitation: Mobility: Walking and moving around Mobility: Walking and Moving Around Current Status (S2876): At least 1 percent but less than 20 percent impaired, limited or restricted Mobility: Walking and Moving Around Goal Status [phone]): At least 1 percent but less than 20 percent impaired, limited or restricted Mobility: Walking and Moving Around Discharge Status [phone]): At least 1 percent but less than 20 percent impaired, limited or restricted    Denice Paradise 07/10/2015, 11:15 AM Kierre Deines,PT Acute Rehabilitation [phone] (pager)

## 2015-07-10 NOTE — Discharge Summary (Signed)
Physician Discharge Summary   Patient ID: George Gonzales MRN: [id number] DOB/AGE: 01/28/1976 39 y.o.  Admit date: 07/09/2015 Discharge date: 07/10/2015  Primary Care Physician:  George Fendt, MD  Discharge Diagnoses:    . Unilateral vestibular weakness . Right sided weakness resolved, inconsistent exam   Consults: Neurology, Dr. Aram Beecham   Recommendations for Outpatient Follow-up:  Continue physical therapy. If patient's symptoms do not improve, recommend neurosurgery follow-up. He reports chronic back pain and injury 10-15 years ago.   TESTS THAT NEED FOLLOW-UP None DIET: Heart healthy diet    Allergies:  No Known Allergies   Discharge Medications:   Medication List    TAKE these medications        amoxicillin 500 MG capsule  Commonly known as:  AMOXIL  Take 1 capsule by mouth 4 (four) times daily.     cyclobenzaprine 5 MG tablet  Commonly known as:  FLEXERIL  Take 1 tablet (5 mg total) by mouth 3 (three) times daily as needed for muscle spasms.     felodipine 2.5 MG 24 hr tablet  Commonly known as:  PLENDIL  Take 2.5 mg by mouth daily as needed.     omeprazole 40 MG capsule  Commonly known as:  PRILOSEC  Take 40 mg by mouth daily as needed.     senna-docusate 8.6-50 MG tablet  Commonly known as:  Senokot-S  Take 1 tablet by mouth at bedtime as needed for moderate constipation.     traMADol 50 MG tablet  Commonly known as:  ULTRAM  Take 1 tablet (50 mg total) by mouth every 6 (six) hours as needed for moderate pain or severe pain.         Brief H and P: For complete details please refer to admission H and P, but in briefPhillip Gonzales is a 39 y.o. male, with past medical history of hypertension and GERD, but at his usual state of health until the night before the admission. Patient reported sudden onset of right side tingling numbness, followed by weakness. He went to bed and when he woke up, his symptoms had not resolved since he presented to [facility] ED.  CT head was negative for any acute infarct. Patient was transferred to [facility] for further workup.   Hospital Course:   Right sided weakness significantly improved. UDS negative. CT head was unremarkable. CT angiogram of the brain showed no anterior and posterior circulation stenosis or occlusion. No infarct. MRI of the brain showed no evidence of acute ischemia or demyelination. Nonspecific 2 subcentimeter frontal cortical lesions, nonspecific MRI of the cervical spine showed mild left C4-5 and C5-6 neural foraminal narrowing, small to moderate left paracentral C5-C6 disc protrusion and annular fissure. Patient was followed closely by neurology and recommended no further stroke workup as patient does not have any acute CVA Patient had markedly inconsistent exam per neurology. He spontaneously started ambulating. Physical therapy and wrote the patient a therapy was obtained and recommended intermittent supervision.  I did recommend him to follow-up with his PCP, neurosurgery if his symptoms of back pain worsens with red flag symptoms of weakness, numbness or tingling in his legs over shoulders/arms due to his chronic back pain and injury several years ago.Marland Kitchen  Hypertension - Currently stable continue felodipine  GERD Continue PPI, continue omeprazole  Day of Discharge BP 119/77 mmHg  Pulse 76  Temp(Src) 98 F (36.7 C) (Oral)  Resp 17  Ht 5\' 10"  (1.778 m)  Wt 67.85 kg (149  lb 9.3 oz)  BMI 21.46 kg/m2  SpO2 100%  Physical Exam: General: Alert and awake oriented x3 not in any acute distress. HEENT: anicteric sclera, pupils reactive to light and accommodation CVS: S1-S2 clear no murmur rubs or gallops Chest: clear to auscultation bilaterally, no wheezing rales or rhonchi Abdomen: soft nontender, nondistended, normal bowel sounds Extremities: no cyanosis, clubbing or edema noted bilaterally, Mild weakness right grip and right lower extremity with poor  effort  Neuro: Cranial nerves II-XII intact, no focal neurological deficits   The results of significant diagnostics from this hospitalization (including imaging, microbiology, ancillary and laboratory) are listed below for reference.    LAB RESULTS: Basic Metabolic Panel:  Recent Labs Lab 07/09/15 1140 07/09/15 1816  NA 136  --   K 3.9  --   CL 102  --   CO2 27  --   GLUCOSE 96  --   BUN 14  --   CREATININE 1.13 0.96  CALCIUM 9.4  --    Liver Function Tests:  Recent Labs Lab 07/09/15 1140  AST 24  ALT 20  ALKPHOS 60  BILITOT 0.7  PROT 7.9  ALBUMIN 4.5   No results for input(s): LIPASE, AMYLASE in the last 168 hours. No results for input(s): AMMONIA in the last 168 hours. CBC:  Recent Labs Lab 07/09/15 1140 07/09/15 1816  WBC 6.6 9.6  NEUTROABS 4.1  --   HGB 16.4 15.5  HCT 47.5 45.1  MCV 87.3 88.6  PLT 259 224   Cardiac Enzymes:  Recent Labs Lab 07/09/15 1140  TROPONINI <0.03   BNP: Invalid input(s): POCBNP CBG: No results for input(s): GLUCAP in the last 168 hours.  Significant Diagnostic Studies:  Ct Angio Head W/cm &/or Wo Cm  07/09/2015   CLINICAL DATA:  39 year old hypertensive pre diabetic male with history of Raynaud's syndrome and myocardial infarction presenting with right upper and lower extremity numbness and weakness for 1 day. Subsequent encounter.  EXAM: CT ANGIOGRAPHY HEAD  TECHNIQUE: Multidetector CT imaging of the head was performed using the standard protocol during bolus administration of intravenous contrast. Multiplanar CT image reconstructions and MIPs were obtained to evaluate the vascular anatomy.  CONTRAST:  177mL OMNIPAQUE IOHEXOL 350 MG/ML SOLN  COMPARISON:  07/09/2015 and 01/21/2004 head CT.  FINDINGS: CT HEAD  Brain: No intracranial hemorrhage. No CT evidence of large acute infarct. No intracranial mass or abnormal enhancement. No hydrocephalus.  Calvarium and skull base: Negative.  Paranasal sinuses: Clear.  Orbits:  Negative.  CTA HEAD  Anterior circulation: Anterior circulation without medium or large size vessel significant stenosis or occlusion. Fetal contribution to the posterior cerebral arteries. 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  Posterior circulation: No significant narrowing medium or large size vessels.  Venous sinuses: Negative  Anatomic variants: Negative  Delayed phase:Negative  IMPRESSION: Anterior and posterior circulation without medium or large size vessel significant stenosis or occlusion.  Fetal contribution to the posterior cerebral arteries.  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  No intracranial hemorrhage.  No CT evidence of large acute infarct.  No intracranial mass or abnormal enhancement.   Electronically Signed   By: Genia Del M.D.   On: 07/09/2015 13:11   Ct Head Wo Contrast  07/09/2015   CLINICAL DATA:  39 year old male with acute right upper and lower extremity numbness and weakness for 1 day.  EXAM: CT HEAD WITHOUT CONTRAST  TECHNIQUE: Contiguous axial images were obtained from the base of  the skull through the vertex without intravenous contrast.  COMPARISON:  01/21/2004 head CT  FINDINGS: No intracranial abnormalities are identified, including mass lesion or mass effect, hydrocephalus, extra-axial fluid collection, midline shift, hemorrhage, or acute infarction.  The visualized bony calvarium is unremarkable.  IMPRESSION: Unremarkable noncontrast head CT.   Electronically Signed   By: Margarette Canada M.D.   On: 07/09/2015 12:18   Mr Brain Wo Contrast  07/09/2015   CLINICAL DATA:  Acute onset RIGHT facial tingling, weakness. Evaluate stroke.  EXAM: MRI HEAD WITHOUT CONTRAST  MRI CERVICAL SPINE WITHOUT CONTRAST  TECHNIQUE: Multiplanar, multiecho pulse sequences of the brain and surrounding structures, and cervical spine, to include the craniocervical junction and cervicothoracic junction,  were obtained without intravenous contrast.  COMPARISON:  CT angiogram of the head July 09, 2015 at 12:27  FINDINGS: MRI HEAD FINDINGS  The ventricles and sulci are normal for patient's age. Subcentimeter FLAIR T2 hyperintensity in RIGHT frontal cortex, axial 14/25. Punctate LEFT cingulate T2 hyperintensity versus flow void, axial 16/25. No suspicious parenchymal signal, mass lesions, mass effect. No reduced diffusion to suggest acute ischemia. No susceptibility artifact to suggest hemorrhage.  No abnormal extra-axial fluid collections. 7 mm proteinaceous pineal cyst. No extra-axial masses though, contrast enhanced sequences would be more sensitive. Normal major intracranial vascular flow voids seen at the skull base.  Ocular globes and orbital contents are unremarkable though not tailored for evaluation. No abnormal sellar expansion. Visualized paranasal sinuses and mastoid air cells are well-aerated. No suspicious calvarial bone marrow signal. Craniocervical junction maintained.  MRI CERVICAL SPINE FINDINGS  Cervical vertebral bodies and posterior elements intact and aligned with maintenance of cervical lordosis. 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 Intervertebral discs demonstrate normal morphology and signal characteristics. No STIR signal abnormality to suggest acute osseous process.  Cervical spinal cord is normal in morphology and signal characteristics. Craniocervical junction is intact. Included prevertebral and paraspinal soft tissues are normal.  Level by level evaluation:  C2-3 and C3-4: No disc bulge, canal stenosis nor neural foraminal narrowing.  C4-5: Small RIGHT central disc protrusion, minimal uncovertebral hypertrophy and facet arthropathy. Mild canal stenosis. Mild LEFT neural foraminal narrowing.  C5-6: Small to moderate LEFT central disc protrusion with annular fissure. Mild canal stenosis. Mild LEFT neural foraminal narrowing.  C6-7 and C7-T1: No disc bulge, canal  stenosis nor neural foraminal narrowing.  IMPRESSION: MRI HEAD: No evidence of acute ischemia nor hyperacute demyelination.  2 subcentimeter frontal cortical lesions, nonspecific though, can be associated with demyelination. If clinical concern for acute inflammatory process, recommend contrast-enhanced sequences. If not performed, recommend follow-up MRI of the brain in 6 months to verify stability of findings.  7 mm pineal cyst.  MRA HEAD: No acute fracture or malalignment. No MR findings of demyelination in the cervical spinal cord.  Small to moderate LEFT central C5-6 disc protrusion and annular fissure. Small RIGHT central C4-5 disc protrusion. Mild canal stenosis at these 2 levels.  Mild LEFT C4-5 and C5-6 neural foraminal narrowing.   Electronically Signed   By: Elon Alas M.D.   On: 07/09/2015 22:22   Mr Cervical Spine Wo Contrast  07/09/2015   CLINICAL DATA:  Acute onset RIGHT facial tingling, weakness. Evaluate stroke.  EXAM: MRI HEAD WITHOUT CONTRAST  MRI CERVICAL SPINE WITHOUT CONTRAST  TECHNIQUE: Multiplanar, multiecho pulse sequences of the brain and surrounding structures, and cervical spine, to include the craniocervical junction and cervicothoracic junction, were obtained without intravenous contrast.  COMPARISON:  CT angiogram of  the head July 09, 2015 at 12:27  FINDINGS: MRI HEAD FINDINGS  The ventricles and sulci are normal for patient's age. Subcentimeter FLAIR T2 hyperintensity in RIGHT frontal cortex, axial 14/25. Punctate LEFT cingulate T2 hyperintensity versus flow void, axial 16/25. No suspicious parenchymal signal, mass lesions, mass effect. No reduced diffusion to suggest acute ischemia. No susceptibility artifact to suggest hemorrhage.  No abnormal extra-axial fluid collections. 7 mm proteinaceous pineal cyst. No extra-axial masses though, contrast enhanced sequences would be more sensitive. Normal major intracranial vascular flow voids seen at the skull base.  Ocular globes  and orbital contents are unremarkable though not tailored for evaluation. No abnormal sellar expansion. Visualized paranasal sinuses and mastoid air cells are well-aerated. No suspicious calvarial bone marrow signal. Craniocervical junction maintained.  MRI CERVICAL SPINE FINDINGS  Cervical vertebral bodies and posterior elements intact and aligned with maintenance of cervical lordosis. 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 Intervertebral discs demonstrate normal morphology and signal characteristics. No STIR signal abnormality to suggest acute osseous process.  Cervical spinal cord is normal in morphology and signal characteristics. Craniocervical junction is intact. Included prevertebral and paraspinal soft tissues are normal.  Level by level evaluation:  C2-3 and C3-4: No disc bulge, canal stenosis nor neural foraminal narrowing.  C4-5: Small RIGHT central disc protrusion, minimal uncovertebral hypertrophy and facet arthropathy. Mild canal stenosis. Mild LEFT neural foraminal narrowing.  C5-6: Small to moderate LEFT central disc protrusion with annular fissure. Mild canal stenosis. Mild LEFT neural foraminal narrowing.  C6-7 and C7-T1: No disc bulge, canal stenosis nor neural foraminal narrowing.  IMPRESSION: MRI HEAD: No evidence of acute ischemia nor hyperacute demyelination.  2 subcentimeter frontal cortical lesions, nonspecific though, can be associated with demyelination. If clinical concern for acute inflammatory process, recommend contrast-enhanced sequences. If not performed, recommend follow-up MRI of the brain in 6 months to verify stability of findings.  7 mm pineal cyst.  MRA HEAD: No acute fracture or malalignment. No MR findings of demyelination in the cervical spinal cord.  Small to moderate LEFT central C5-6 disc protrusion and annular fissure. Small RIGHT central C4-5 disc protrusion. Mild canal stenosis at these 2 levels.  Mild LEFT C4-5 and C5-6 neural foraminal  narrowing.   Electronically Signed   By: Elon Alas M.D.   On: 07/09/2015 22:22    2D ECHO:   Disposition and Follow-up:     Discharge Instructions    Diet - low sodium heart healthy    Complete by:  As directed      Increase activity slowly    Complete by:  As directed             DISPOSITION: home    DISCHARGE FOLLOW-UP Follow-up Information    Follow up with AVBUERE,EDWIN A, MD. Schedule an appointment as soon as possible for a visit in 2 weeks.   Specialty:  Internal Medicine   Why:  for hospital follow-up   Contact information:   [address] [place] [address] [phone]        Time spent on Discharge: 25 mins   Signed:   Brooklynn Brandenburg M.D. [facility] 07/10/2015, 11:09 AM Pager: [phone]

## 2015-07-10 NOTE — Care Management Note (Signed)
Case Management Note  Patient Details  Name: George Gonzales MRN: [id number] Date of Birth: 04-08-1976  Subjective/Objective:        CM following for progression and d/c planning.            Action/Plan: No d/c needs identified at this time.   Expected Discharge Date:       07/10/2015           Expected Discharge Plan:  Home/Self Care  In-House Referral:  NA  Discharge planning Services  NA  Post Acute Care Choice:  NA Choice offered to:  NA  DME Arranged:    DME Agency:     HH Arranged:    HH Agency:     Status of Service:  Completed, signed off  Medicare Important Message Given:    Date Medicare IM Given:    Medicare IM give by:    Date Additional Medicare IM Given:    Additional Medicare Important Message give by:     If discussed at [place] of Stay Meetings, dates discussed:    Additional Comments:  Adron Bene, RN 07/10/2015, 11:06 AM

## 2015-07-10 NOTE — Progress Notes (Signed)
NEURO HOSPITALIST PROGRESS NOTE   SUBJECTIVE:                                                                                                                        No new neurological developments. Eating breakfast in bed, looks very comfortable. Stated that the right side is improving but still feels weak and numb. MRI brain and cervical spine were independently reviewed and showed no acute abnormality. Non specific 2 subcentimeter frontal cortical lesions, nonspecific.    OBJECTIVE:                                                                                                                           Vital signs in last 24 hours: Temp:  [98 F (36.7 C)-98.5 F (36.9 C)] 98 F (36.7 C) (10/03 0757) Pulse Rate:  [59-85] 76 (10/03 0757) Resp:  [10-158] 17 (10/03 0757) BP: (91-134)/(56-85) 119/77 mmHg (10/03 0757) SpO2:  [98 %-100 %] 100 % (10/03 0757) Weight:  [67.85 kg (149 lb 9.3 oz)-68.947 kg (152 lb)] 67.85 kg (149 lb 9.3 oz) (10/03 0548)  Intake/Output from previous day: 10/02 0701 - 10/03 0700 In: 970 [I.V.:970] Out: 1076 [Urine:1075; Stool:1] Intake/Output this shift: Total I/O In: -  Out: 400 [Urine:400] Nutritional status: Diet regular Room service appropriate?: Yes; Fluid consistency:: Thin  Past Medical History  Diagnosis Date  . Raynaud's syndrome   . Hypertension   . GERD (gastroesophageal reflux disease)   . History of MI (myocardial infarction)     pt states at age 31 had Stress-induced heart attack/  also states had myoview Jan 2016 w/ PCP and it was normal  . History of chest pain   . History of chronic bronchitis   . Prediabetes   . Lower urinary tract symptoms (LUTS)   . Gross hematuria   . History of kidney stones   . History of primary genital syphilis     1997-- treated w/ penicillin injection's     Physical exam:  Constitutional: well developed, pleasant male in no apparent distress. Eyes: no jaundice  or exophthalmos.  Head: normocephalic. Neck: supple, no bruits, no JVD. Cardiac: no murmurs. Lungs: clear. Abdomen: soft, no tender, no mass. Extremities: no edema, clubbing, or cyanosis.  Skin: no rash   Neurologic Exam:  General: NAD Mental Status: Alert, oriented, thought content appropriate.  Speech fluent without evidence of aphasia.  Able to follow 3 step commands without difficulty. Cranial Nerves: II: Discs flat bilaterally; Visual fields grossly normal, pupils equal, round, reactive to light and accommodation III,IV, VI: ptosis not present, extra-ocular motions intact bilaterally V,VII: smile symmetric, facial light touch sensation normal bilaterally VIII: hearing normal bilaterally IX,X: uvula rises symmetrically XI: bilateral shoulder shrug XII: midline tongue extension without atrophy or fasciculations  Motor: Mild weakness right grip and right LE proximally, but not full effort by patient. Sensory: Pinprick and light touch intact throughout, bilaterally Deep Tendon Reflexes:  Right: Upper Extremity   Left: Upper extremity   biceps (C-5 to C-6) 2/4   biceps (C-5 to C-6) 2/4 tricep (C7) 2/4    triceps (C7) 2/4 Brachioradialis (C6) 2/4  Brachioradialis (C6) 2/4  Lower Extremity Lower Extremity  quadriceps (L-2 to L-4) 2/4   quadriceps (L-2 to L-4) 2/4 Achilles (S1) 2/4   Achilles (S1) 2/4  Plantars: Right: downgoing   Left: downgoing Cerebellar: normal finger-to-nose,  normal heel-to-shin test Gait:  No tested    Lab Results: Lab Results  Component Value Date/Time   CHOL 139 07/10/2015 04:30 AM   Lipid Panel  Recent Labs  07/10/15 0430  CHOL 139  TRIG 39  HDL 41  CHOLHDL 3.4  VLDL 8  LDLCALC 90    Studies/Results: Ct Angio Head W/cm &/or Wo Cm  07/09/2015   CLINICAL DATA:  39 year old hypertensive pre diabetic male with history of Raynaud's syndrome and myocardial infarction presenting with right upper and lower extremity numbness and  weakness for 1 day. Subsequent encounter.  EXAM: CT ANGIOGRAPHY HEAD  TECHNIQUE: Multidetector CT imaging of the head was performed using the standard protocol during bolus administration of intravenous contrast. Multiplanar CT image reconstructions and MIPs were obtained to evaluate the vascular anatomy.  CONTRAST:  159mL OMNIPAQUE IOHEXOL 350 MG/ML SOLN  COMPARISON:  07/09/2015 and 01/21/2004 head CT.  FINDINGS: CT HEAD  Brain: No intracranial hemorrhage. No CT evidence of large acute infarct. No intracranial mass or abnormal enhancement. No hydrocephalus.  Calvarium and skull base: Negative.  Paranasal sinuses: Clear.  Orbits: Negative.  CTA HEAD  Anterior circulation: Anterior circulation without medium or large size vessel significant stenosis or occlusion. Fetal contribution to the posterior cerebral arteries. 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  Posterior circulation: No significant narrowing medium or large size vessels.  Venous sinuses: Negative  Anatomic variants: Negative  Delayed phase:Negative  IMPRESSION: Anterior and posterior circulation without medium or large size vessel significant stenosis or occlusion.  Fetal contribution to the posterior cerebral arteries.  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  No intracranial hemorrhage.  No CT evidence of large acute infarct.  No intracranial mass or abnormal enhancement.   Electronically Signed   By: Genia Del M.D.   On: 07/09/2015 13:11   Ct Head Wo Contrast  07/09/2015   CLINICAL DATA:  39 year old male with acute right upper and lower extremity numbness and weakness for 1 day.  EXAM: CT HEAD WITHOUT CONTRAST  TECHNIQUE: Contiguous axial images were obtained from the base of the skull through the vertex without intravenous contrast.  COMPARISON:  01/21/2004 head CT  FINDINGS: No intracranial abnormalities are identified, including mass lesion or mass  effect, hydrocephalus, extra-axial fluid collection, midline shift, hemorrhage, or acute infarction.  The visualized bony calvarium  is unremarkable.  IMPRESSION: Unremarkable noncontrast head CT.   Electronically Signed   By: Margarette Canada M.D.   On: 07/09/2015 12:18   Mr Brain Wo Contrast  07/09/2015   CLINICAL DATA:  Acute onset RIGHT facial tingling, weakness. Evaluate stroke.  EXAM: MRI HEAD WITHOUT CONTRAST  MRI CERVICAL SPINE WITHOUT CONTRAST  TECHNIQUE: Multiplanar, multiecho pulse sequences of the brain and surrounding structures, and cervical spine, to include the craniocervical junction and cervicothoracic junction, were obtained without intravenous contrast.  COMPARISON:  CT angiogram of the head July 09, 2015 at 12:27  FINDINGS: MRI HEAD FINDINGS  The ventricles and sulci are normal for patient's age. Subcentimeter FLAIR T2 hyperintensity in RIGHT frontal cortex, axial 14/25. Punctate LEFT cingulate T2 hyperintensity versus flow void, axial 16/25. No suspicious parenchymal signal, mass lesions, mass effect. No reduced diffusion to suggest acute ischemia. No susceptibility artifact to suggest hemorrhage.  No abnormal extra-axial fluid collections. 7 mm proteinaceous pineal cyst. No extra-axial masses though, contrast enhanced sequences would be more sensitive. Normal major intracranial vascular flow voids seen at the skull base.  Ocular globes and orbital contents are unremarkable though not tailored for evaluation. No abnormal sellar expansion. Visualized paranasal sinuses and mastoid air cells are well-aerated. No suspicious calvarial bone marrow signal. Craniocervical junction maintained.  MRI CERVICAL SPINE FINDINGS  Cervical vertebral bodies and posterior elements intact and aligned with maintenance of cervical lordosis. 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 Intervertebral discs demonstrate normal morphology and signal characteristics. No STIR signal abnormality to  suggest acute osseous process.  Cervical spinal cord is normal in morphology and signal characteristics. Craniocervical junction is intact. Included prevertebral and paraspinal soft tissues are normal.  Level by level evaluation:  C2-3 and C3-4: No disc bulge, canal stenosis nor neural foraminal narrowing.  C4-5: Small RIGHT central disc protrusion, minimal uncovertebral hypertrophy and facet arthropathy. Mild canal stenosis. Mild LEFT neural foraminal narrowing.  C5-6: Small to moderate LEFT central disc protrusion with annular fissure. Mild canal stenosis. Mild LEFT neural foraminal narrowing.  C6-7 and C7-T1: No disc bulge, canal stenosis nor neural foraminal narrowing.  IMPRESSION: MRI HEAD: No evidence of acute ischemia nor hyperacute demyelination.  2 subcentimeter frontal cortical lesions, nonspecific though, can be associated with demyelination. If clinical concern for acute inflammatory process, recommend contrast-enhanced sequences. If not performed, recommend follow-up MRI of the brain in 6 months to verify stability of findings.  7 mm pineal cyst.  MRA HEAD: No acute fracture or malalignment. No MR findings of demyelination in the cervical spinal cord.  Small to moderate LEFT central C5-6 disc protrusion and annular fissure. Small RIGHT central C4-5 disc protrusion. Mild canal stenosis at these 2 levels.  Mild LEFT C4-5 and C5-6 neural foraminal narrowing.   Electronically Signed   By: Elon Alas M.D.   On: 07/09/2015 22:22   Mr Cervical Spine Wo Contrast  07/09/2015   CLINICAL DATA:  Acute onset RIGHT facial tingling, weakness. Evaluate stroke.  EXAM: MRI HEAD WITHOUT CONTRAST  MRI CERVICAL SPINE WITHOUT CONTRAST  TECHNIQUE: Multiplanar, multiecho pulse sequences of the brain and surrounding structures, and cervical spine, to include the craniocervical junction and cervicothoracic junction, were obtained without intravenous contrast.  COMPARISON:  CT angiogram of the head July 09, 2015 at  12:27  FINDINGS: MRI HEAD FINDINGS  The ventricles and sulci are normal for patient's age. Subcentimeter FLAIR T2 hyperintensity in RIGHT frontal cortex, axial 14/25. Punctate LEFT cingulate T2 hyperintensity versus flow void, axial 16/25.  No suspicious parenchymal signal, mass lesions, mass effect. No reduced diffusion to suggest acute ischemia. No susceptibility artifact to suggest hemorrhage.  No abnormal extra-axial fluid collections. 7 mm proteinaceous pineal cyst. No extra-axial masses though, contrast enhanced sequences would be more sensitive. Normal major intracranial vascular flow voids seen at the skull base.  Ocular globes and orbital contents are unremarkable though not tailored for evaluation. No abnormal sellar expansion. Visualized paranasal sinuses and mastoid air cells are well-aerated. No suspicious calvarial bone marrow signal. Craniocervical junction maintained.  MRI CERVICAL SPINE FINDINGS  Cervical vertebral bodies and posterior elements intact and aligned with maintenance of cervical lordosis. 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 Intervertebral discs demonstrate normal morphology and signal characteristics. No STIR signal abnormality to suggest acute osseous process.  Cervical spinal cord is normal in morphology and signal characteristics. Craniocervical junction is intact. Included prevertebral and paraspinal soft tissues are normal.  Level by level evaluation:  C2-3 and C3-4: No disc bulge, canal stenosis nor neural foraminal narrowing.  C4-5: Small RIGHT central disc protrusion, minimal uncovertebral hypertrophy and facet arthropathy. Mild canal stenosis. Mild LEFT neural foraminal narrowing.  C5-6: Small to moderate LEFT central disc protrusion with annular fissure. Mild canal stenosis. Mild LEFT neural foraminal narrowing.  C6-7 and C7-T1: No disc bulge, canal stenosis nor neural foraminal narrowing.  IMPRESSION: MRI HEAD: No evidence of acute ischemia nor  hyperacute demyelination.  2 subcentimeter frontal cortical lesions, nonspecific though, can be associated with demyelination. If clinical concern for acute inflammatory process, recommend contrast-enhanced sequences. If not performed, recommend follow-up MRI of the brain in 6 months to verify stability of findings.  7 mm pineal cyst.  MRA HEAD: No acute fracture or malalignment. No MR findings of demyelination in the cervical spinal cord.  Small to moderate LEFT central C5-6 disc protrusion and annular fissure. Small RIGHT central C4-5 disc protrusion. Mild canal stenosis at these 2 levels.  Mild LEFT C4-5 and C5-6 neural foraminal narrowing.   Electronically Signed   By: Elon Alas M.D.   On: 07/09/2015 22:22    MEDICATIONS                                                                                                                        Scheduled: .  stroke: mapping our early stages of recovery book   Does not apply Once  . aspirin  300 mg Rectal Daily   Or  . aspirin  325 mg Oral Daily  . enoxaparin (LOVENOX) injection  40 mg Subcutaneous Q24H  . felodipine  2.5 mg Oral q morning - 10a  . pantoprazole  40 mg Oral Daily    ASSESSMENT/PLAN:  39 y/o male admitted with acute onset right sided weakness, trouble speaking that lasted several hours, and inconsistent exam. MRI brain and cervical spine without acute abnormality or other lesion that could explain his syndrome. Has mild weakness right grip and right LE proximally, but not full effort by patient. His initial symptoms developed Saturday night and he still complains of right side weakness, neuroimaging is unrevealing, and his neuro-exam is inconsistent. Will not suggest further inpatient neurological intervention. PT. Neuro will sign off  Dorian Pod, MD [facility] [phone]  07/10/2015, 8:36 AM

## 2015-07-11 LAB — HEMOGLOBIN A1C
Hgb A1c MFr Bld: 5.9 % — ABNORMAL HIGH (ref 4.8–5.6)
MEAN PLASMA GLUCOSE: 123 mg/dL

## 2015-08-03 ENCOUNTER — Ambulatory Visit: Payer: 59 | Admitting: Occupational Therapy

## 2015-08-03 ENCOUNTER — Encounter: Payer: Self-pay | Admitting: Occupational Therapy

## 2015-08-03 ENCOUNTER — Ambulatory Visit: Payer: 59 | Attending: Internal Medicine | Admitting: Physical Therapy

## 2015-08-03 ENCOUNTER — Encounter: Payer: Self-pay | Admitting: Physical Therapy

## 2015-08-03 DIAGNOSIS — R2681 Unsteadiness on feet: Secondary | ICD-10-CM

## 2015-08-03 DIAGNOSIS — R202 Paresthesia of skin: Secondary | ICD-10-CM | POA: Diagnosis present

## 2015-08-03 DIAGNOSIS — R269 Unspecified abnormalities of gait and mobility: Secondary | ICD-10-CM | POA: Insufficient documentation

## 2015-08-03 DIAGNOSIS — M6281 Muscle weakness (generalized): Secondary | ICD-10-CM

## 2015-08-03 DIAGNOSIS — R29898 Other symptoms and signs involving the musculoskeletal system: Secondary | ICD-10-CM | POA: Diagnosis present

## 2015-08-03 DIAGNOSIS — R200 Anesthesia of skin: Secondary | ICD-10-CM

## 2015-08-03 DIAGNOSIS — M6289 Other specified disorders of muscle: Secondary | ICD-10-CM | POA: Insufficient documentation

## 2015-08-03 NOTE — Therapy (Signed)
[facility] [address] [place], [place], [address] Phone: [phone]   Fax:  [phone]  Physical Therapy Evaluation  Patient Details  Name: George Gonzales MRN: [id number] Date of Birth: 09-10-1976 Referring Provider: Nolene Ebbs, MD  Encounter Date: 08/03/2015      PT End of Session - 08/03/15 1326    Visit Number 1   Number of Visits 7  eval + 6 visits   Date for PT Re-Evaluation 09/17/15   Authorization Type [employer]   PT Start Time 0935   PT Stop Time 1015   PT Time Calculation (min) 40 min   Activity Tolerance Patient limited by fatigue   Behavior During Therapy [facility] for tasks assessed/performed      Past Medical History  Diagnosis Date  . Raynaud's syndrome   . Hypertension   . GERD (gastroesophageal reflux disease)   . History of MI (myocardial infarction)     pt states at age 67 had Stress-induced heart attack/  also states had myoview Jan 2016 w/ PCP and it was normal  . History of chest pain   . History of chronic bronchitis   . Prediabetes   . Lower urinary tract symptoms (LUTS)   . Gross hematuria   . History of kidney stones   . History of primary genital syphilis     1997-- treated w/ penicillin injection's    Past Surgical History  Procedure Laterality Date  . No past surgeries    . Cystoscopy N/A 02/17/2015    Procedure: CYSTOSCOPY;  Surgeon: Lowella Bandy, MD;  Location: [facility];  Service: Urology;  Laterality: N/A;    There were no vitals filed for this visit.  Visit Diagnosis:  Abnormality of gait - Plan: PT plan of care cert/re-cert  Weakness of right lower extremity - Plan: PT plan of care cert/re-cert  Unsteadiness - Plan: PT plan of care cert/re-cert      Subjective Assessment - 08/03/15 0949    Subjective Pt with onset of R-sided numbness/tingling, R-sided weakness, and difficulty sleeping onset 07/08/15. Pt did not seek medical attention at that time but  instead went to sleep. Woke up the next morning with improved symptoms and drove himself to the hospital. Pt at [facility] from 10/2 - 07/10/15 for CVA workup; CT of brain showed no acute findings and neurologist noted inconsistent physical exam. Pt discharged home using rolling walker. Today pt arrived to PT eval using SBQC with RUE. Pt states, "This stroke has really thrown me for a loop. Actually, I saw a neurosurgeon who says there is a spot on my head; might be a tumor or cancer. He said my brain activity looked normal. He said it's just a spot there and he will re-evaluate me in the next 6 months." Pt states., "My life is completely different; I can't walk without this stupid cane. 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 Yes   Pain Score 4    Pain Location Neck   Pain Orientation Right   Pain Descriptors / Indicators Other (Comment)  "kind of stiff"   Pain Type Acute pain   Pain Onset Yesterday   Pain Frequency Constant   Aggravating Factors  "I don't know; I honestly think it's just the weather."   Pain Relieving Factors heat and  massage            [facility] PT Assessment - 08/03/15 0001    Assessment   Medical Diagnosis Right sided numbness and tingling   Referring Provider Nolene Ebbs, MD   Onset Date/Surgical Date 07/08/15   Precautions   Precautions Fall   Balance Screen   Has the patient fallen in the past 6 months Yes   How many times? 1   Has the patient had a decrease in activity level because of a fear of falling?  Yes   Is the patient reluctant to leave their home because of a fear of falling?  No   Home Environment   Living Environment Other (Comment)   Additional Comments Pt currently resides on the 3rd floor of a hotel. Pt must negotiate 3 flights of stairs tpo access room. To ascend stairs, pt places quad cane in belt loop and  ascends/descends stairs.   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 With cueing for R heel strike during gait, pt exhibited normal R ankle DF ROM.   Strength   Overall Strength Deficits   Overall Strength Comments LLE grosslt   Strength Assessment Site Hip;Knee;Ankle   Right/Left Hip Right   Right Hip Flexion 3-/5  then 4-/5   Right/Left Knee Right   Right Knee Flexion 4+/5   Right Knee Extension 3-/5  4/5 when tested in standing   Right/Left Ankle Right   Right Ankle Dorsiflexion 3-/5  3-/5 R side only; 4/5 when BLE tested simultaneously   Right Ankle Plantar Flexion 4-/5   Transfers   Transfers Sit to Stand;Stand to Sit   Sit to Stand 5: Supervision   Stand to Sit 5: Supervision   Ambulation/Gait   Ambulation/Gait Yes   Ambulation/Gait Assistance 5: Supervision;4: Min guard   Ambulation Distance (Feet) 120 Feet  x2   Assistive device Small based quad cane   Gait Pattern Step-through pattern;Decreased hip/knee flexion - right;Decreased dorsiflexion - right;Decreased weight shift to right;Decreased step length - left;Decreased trunk rotation;Trunk flexed;Poor foot clearance - right  Able to self-correct R ankle DF when cued   Ambulation Surface Level;Indoor   Gait velocity 1.46 ft/sec   Gait Comments Gait trial x120' ended due to pt fatigue, labored breathing, decreased gait stability.                           PT Education - 08/03/15 1328    Education provided Yes   Education Details PT eval findings, goals, and POC.   Person(s)  Educated Patient   Methods Explanation   Comprehension Verbalized understanding          PT Short Term Goals - 08/03/15 1339    PT SHORT TERM GOAL #1   Title Pt will perform initial HEP with mod I using paper handout to maximize functional gains made in PT. Target date: 08/24/15   PT SHORT TERM GOAL #2   Title Pt will improve gait velocity from  1.46 ft/sec to >/= 1.8 ft/sec to indicate decreased risk of recurrent falls.  Target date: 08/24/15   PT SHORT TERM GOAL #3   Title Complete 6MWT to assess/address functional endurance and improve 6MWT distance by 56' from baseline to progress toward improved functional endurance.  Target date: 08/24/15           PT Long Term Goals - 08/03/15 1343    PT LONG TERM GOAL #1   Title Pt will verbalize understanding of fall prevention strategies to decrease risk of falls in home environment. Target date: 09/14/15   PT LONG TERM GOAL #2   Title Pt will negotiate 3 flights of stairs with 2 rails with mod I while safely managing assistive device to indicate pt ability to safely enter/exit current residence. Target date: 09/14/15   PT LONG TERM GOAL #3   Title Pt will improve 6MWT distance by 113' from baseline to indicate significant improvement in functional activity tolerance. Target date: 09/14/15   PT LONG TERM GOAL #4   Title Pt will increase gait velocity from 1.46 ft/sec to > / = 2.06 ft/sec to indicate increased efficiency of ambulation. Target date: 09/14/15               Plan - 08/03/15 1329    Clinical Impression Statement Pt is a 39 y/o M referred to outpatient PT to address functional impairments associated with weakness, numbness, and paresthesia on R side. Onset of symptoms was 07/08/15; pt hospitalized from 10/2 - 07/10/15 for futher work up. PT evaluation reveals the following impairments: decreased RLE strength, gait impairments, limited functional endurance, decreased stability/independence with functioanal transfers and  ambulation, and gait velocity suggestive of recurrent fall risk. Noted inconsistent findings/deficits during PT evaluation. Pt will benefit from skilled outpatient PT 1x/week for up to 6 weeks to address saif impairments.   Pt will benefit from skilled therapeutic intervention in order to improve on the following deficits Abnormal gait;Decreased activity tolerance;Decreased endurance;Decreased balance;Decreased knowledge of use of DME;Postural dysfunction;Pain;Other (comment);Decreased strength;Decreased mobility  Pain will be monitored but not directly addressed during PT due to nature of pain   Rehab Potential Fair   Clinical Impairments Affecting Rehab Potential inconsistent findings on PT assessment; no significant findings on CT/MRI   PT Frequency 1x / week   PT Duration 6 weeks   PT Treatment/Interventions ADLs/Self Care Home Management;Gait training;Stair training;Functional mobility training;Therapeutic activities;Therapeutic exercise;Balance training;Neuromuscular re-education;Patient/family education;Manual techniques;Vestibular   PT Next Visit Plan Assess stair negotiation (3 flights with 2 rails and SBQC and 6MWT; initiate HEP and walking program (with RW, if safe)   Consulted and Agree with Plan of Care Patient         Problem List Patient Active Problem List   Diagnosis Date Noted  . 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 Right sided weakness 07/09/2015  . HTN (hypertension) 07/09/2015  . GERD (gastroesophageal reflux disease) 07/09/2015  . WEIGHT LOSS 03/28/2009  . GERD 09/22/2008  . POLYURIA 09/22/2008  . VIRAL INFECTION 07/01/2007  . PHARYNGITIS 07/01/2007  . HEMORRHOIDS, EXTERNAL 06/12/2007  . BACK PAIN, CHRONIC 06/11/2007  . SNORING 06/11/2007  . GONORRHEA 03/13/2007  . 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 FATIGUE 09/12/2006  . CHEST PAIN, NON-CARDIAC 09/18/2004  . SYPHILIS 10/08/1995    Billie Ruddy, PT, DPT [facility] [address] [place], [place], [address] Phone: [phone]   Fax:  [phone] 08/03/2015, 1:49 PM   Name: George Gonzales MRN: [id number] Date of Birth:  09/03/1976    

## 2015-08-03 NOTE — Therapy (Signed)
[facility] [address] [place], [place], [address] Phone: [phone]   Fax:  [phone]  Occupational Therapy Evaluation  Patient Details  Name: George Gonzales MRN: [id number] Date of Birth: 04-06-1976 No Data Recorded  Encounter Date: 08/03/2015      OT End of Session - 08/03/15 1254    OT Start Time 1015   OT Stop Time 1102   OT Time Calculation (min) 47 min   Activity Tolerance Patient tolerated treatment well   Behavior During Therapy [facility] for tasks assessed/performed      Past Medical History  Diagnosis Date  . Raynaud's syndrome   . Hypertension   . GERD (gastroesophageal reflux disease)   . History of MI (myocardial infarction)     pt states at age 53 had Stress-induced heart attack/  also states had myoview Jan 2016 w/ PCP and it was normal  . History of chest pain   . History of chronic bronchitis   . Prediabetes   . Lower urinary tract symptoms (LUTS)   . Gross hematuria   . History of kidney stones   . History of primary genital syphilis     1997-- treated w/ penicillin injection's    Past Surgical History  Procedure Laterality Date  . No past surgeries    . Cystoscopy N/A 02/17/2015    Procedure: CYSTOSCOPY;  Surgeon: Lowella Bandy, MD;  Location: [facility];  Service: Urology;  Laterality: N/A;    There were no vitals filed for this visit.  Visit Diagnosis:  Right-sided muscle weakness - Plan: Ot plan of care cert/re-cert  Right arm numbness - Plan: Ot plan of care cert/re-cert  Paresthesias in right hand - Plan: Ot plan of care cert/re-cert      Subjective Assessment - 08/03/15 1023    Subjective  Pt reports that on Jul 08, 2015 pt noticed right sided weakness "my fingers and feet went numb and tingly a little bit" He experienced weakness where he couldn't walk and speaking was difficulty as well. He went to the hospital on Jul 09, 2015 he went to hospital and was  admitted. He presents today with dx weakness, numbness and paresthesias right side. He reports that he feels as though his hand and arm have improved but he experiences weakness at times. He states that he has a h/o neck, back and leg pain greater than RUE pain at this time    Patient Stated Goals Pt reports that he feels as though his RLE is weak and numb but questions if his hand is at baseline due to his h/o Raynaud's. Increase strength overall.   Currently in Pain? 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   ADL   Eating/Feeding Independent   Grooming Independent   Personnel officer  Upper Body Dressing Independent   Lower Body Dressing Modified independent   Toilet Tranfer Simulated;Modified independent   Toileting - Clothing Manipulation Simulated;Modified independent   Tub/Shower Transfer Modified independent   ADL comments Pt reports that he is at baseline level for ADL's and IADL's   IADL   Light Housekeeping Performs light daily tasks such as dishwashing, bed making;Performs light daily tasks but cannot maintain acceptable level of cleanliness   Meal Prep Plans, prepares and serves adequate meals independently   Community Mobility --  Ambulates with quad cane in R hand   Mobility   Mobility Status History of falls  Uses quad cane in R hand   Mobility Status Comments Ambulates slowly w/ quad cane in right hand   Written  Expression   Dominant Hand Right   Handwriting --  Baseline level right hand   Activity Tolerance   Activity Tolerance Comments Decreased endurance overall for functional mobility and daily activities   Cognition   Overall Cognitive Status Within Functional Limits for tasks assessed   Sensation   Light Touch Appears Intact   Hot/Cold Appears Intact   Coordination   Gross Motor Movements are Fluid and Coordinated Yes   9 Hole Peg Test Left;Right   Right 9 Hole Peg Test 22.11seconds   Left 9 Hole Peg Test 20.31 seconds   Hand Function   Right Hand Gross Grasp Impaired   Right Hand Grip (lbs) 40#   Left Hand Gross Grasp Functional   Left Hand Grip (lbs) 110       Pt reports that he has stress ball and UE/hand exercises that he was issued as in-pt. He was educated to bring stress ball and exercises into next appointment at which time they will be reassessed and upgraded to UE theraband ex's and theraputty secondary to deficits RUE strength as noted during assessment today. He verbalized understanding of this.                   OT Education - 08/03/15 1103    Education provided Yes   Education Details Bring stress ball and Hand/UE ex's to next visit in prep for upgrading HEP   Person(s) Educated Patient   Methods Explanation   Comprehension Verbalized understanding          OT Short Term Goals - 08/03/15 1312    OT SHORT TERM GOAL #1   Title Pt will be I HEP for RUE strengthening    Time 3   Period Weeks   Status New   OT SHORT TERM GOAL #2   Title Pt will tolerate 62min Bilateral UE ther ex/conditioning w/o a rest break   Time 3   Period Weeks   Status New           OT Long Term Goals - 08/03/15 1315    OT LONG TERM GOAL #1   Title Pt will be I upgraded HEP RUE strengthening   Time 6   Period Weeks   Status New   OT LONG TERM GOAL #2   Title Pt will demonstrate increase R hand grip strength by 10# or greater as seen by Grip testing   Baseline R  = 38, 40# (vs L 110#)   Time 6   Period Weeks   Status New   OT LONG TERM GOAL #3   Title Pt will demonstrate increased coordination Right hand as seen by improved 9 hole peg test score by 3-5 seconds or more   Baseline  22.10 seconds right   Time 6   Period Weeks   Status New   OT LONG TERM GOAL #4   Title Pt will demonstrate increased endurance as seen by ability to participate in 47min therapy session for R UE strengthening nad coordination w/ 1 rest break or less    Time 6   Period Weeks   Status New               Plan - 08/03/15 1255    Clinical Impression Statement Pt is a 39 y/o male whom presents with dx weakness, numbness and paresthesias right side. He was admitted to the hospital on 07/09/15 CT & MRI were both negative for acute infarct and neurology did not pick pt up. While he was in the hospital, his symptoms resolved, he was able to walk, numbness resolved in UE and he was d/c home per chart review and assessment today in the clinic. He was evaluated by acute OT as in-pt and was given HEP for hand strengthening ex's using a stress ball. He presents today with an inconsistent exam related to right UE weakness & deficits (he initially stated that his right hand issues had all resolved and later stated decreased strength and endurance. MMT RUE overall was WFL's however grip assessment using JAMAR was 38, 40# on right vs 110# on left. He may benefit from trial of OT with focus on strengthening right UE, increasing endurance and coordination.    Pt will benefit from skilled therapeutic intervention in order to improve on the following deficits (Retired) Decreased endurance;Impaired UE functional use;Decreased coordination;Decreased strength;Pain   Rehab Potential Fair   Clinical Impairments Affecting Rehab Potential Significant PMH including Raynauds (that pt states presents as R numbness and paresthesias at times); unknown R sided weakness w/ negative MRI and CT scans.  Inconsistent findings during assessment.   OT Frequency 1x / week   OT Duration 6 weeks   OT Treatment/Interventions Therapeutic exercise;Patient/family education;Therapeutic exercises;Therapeutic activities;Self-care/ADL training   Plan Review handout for R UE stress ball ex's as issued acutely (pt to bring to next visit)  and upgrade to theraputty ex's as well as theraband for UE HEP/strengthening.   Consulted and Agree with Plan of Care Patient        Problem List Patient Active Problem List   Diagnosis Date Noted  . 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 Right sided weakness 07/09/2015  . HTN (hypertension) 07/09/2015  . GERD (gastroesophageal reflux disease) 07/09/2015  . WEIGHT LOSS 03/28/2009  . GERD 09/22/2008  . POLYURIA 09/22/2008  . VIRAL INFECTION 07/01/2007  . PHARYNGITIS 07/01/2007  . HEMORRHOIDS, EXTERNAL 06/12/2007  . BACK PAIN, CHRONIC 06/11/2007  . SNORING 06/11/2007  . GONORRHEA 03/13/2007  . 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 FATIGUE 09/12/2006  . CHEST PAIN, NON-CARDIAC 09/18/2004  . SYPHILIS 10/08/1995    Percell Miller Beth Dixon, OTR/L 08/03/2015, 1:31 PM  [facility] [address] [place], [place], [address] Phone: [phone]   Fax:  [phone]  Name: George Gonzales MRN: [id number] Date of Birth: 03-03-1976

## 2015-08-03 NOTE — Patient Instructions (Signed)
Pt reports that he has stress ball and UE/hand exercises that he was issued as in-pt. He was educated to bring stress ball and exercises into next appointment at which time they will be reassessed and upgraded to UE theraband ex's and theraputty secondary to deficits RUE strength as noted during assessment today. He verbalized understanding of this.

## 2015-08-08 ENCOUNTER — Ambulatory Visit: Payer: 59 | Admitting: Occupational Therapy

## 2015-08-08 ENCOUNTER — Ambulatory Visit: Payer: 59 | Attending: Internal Medicine | Admitting: Physical Therapy

## 2015-08-08 ENCOUNTER — Encounter: Payer: Self-pay | Admitting: Occupational Therapy

## 2015-08-08 DIAGNOSIS — R200 Anesthesia of skin: Secondary | ICD-10-CM

## 2015-08-08 DIAGNOSIS — R269 Unspecified abnormalities of gait and mobility: Secondary | ICD-10-CM | POA: Diagnosis not present

## 2015-08-08 DIAGNOSIS — R29898 Other symptoms and signs involving the musculoskeletal system: Secondary | ICD-10-CM | POA: Diagnosis present

## 2015-08-08 DIAGNOSIS — R202 Paresthesia of skin: Secondary | ICD-10-CM | POA: Insufficient documentation

## 2015-08-08 DIAGNOSIS — M6281 Muscle weakness (generalized): Secondary | ICD-10-CM

## 2015-08-08 DIAGNOSIS — M6289 Other specified disorders of muscle: Secondary | ICD-10-CM | POA: Insufficient documentation

## 2015-08-08 DIAGNOSIS — R2681 Unsteadiness on feet: Secondary | ICD-10-CM | POA: Insufficient documentation

## 2015-08-08 NOTE — Patient Instructions (Addendum)
Walking Program:   Begin walking for exercise for 5.5 minutes, 1-2 times/day, 5 days/week.   Progress your walking program by adding 1 minute to your routine each week, as tolerated. Be sure to wear good walking shoes, walk in a safe environment, use your [facility] and only progress to your tolerance.      Be sure to walk up the stairs sideways with both hands on the RIGHT rail. Lead with your LEFT leg on the way up and with your RIGHT leg on the way down.

## 2015-08-08 NOTE — Therapy (Signed)
[facility] [address] [place], [place], [address] Phone: [phone]   Fax:  [phone]  Occupational Therapy Treatment  Patient Details  Name: George Gonzales MRN: [id number] Date of Birth: [birth year] No Data Recorded  Encounter Date: 08/08/2015      OT End of Session - 08/08/15 0947    Visit Number 2   Number of Visits 6   Date for OT Re-Evaluation 09/28/15   Authorization Type [employer] - Check authorization/coverage   OT Start Time [id number]   OT Stop Time 1019   OT Time Calculation (min) 42 min   Activity Tolerance Patient tolerated treatment well   Behavior During Therapy [facility] for tasks assessed/performed      Past Medical History  Diagnosis Date  . Raynaud's syndrome   . Hypertension   . GERD (gastroesophageal reflux disease)   . History of MI (myocardial infarction)     pt states at age 49 had Stress-induced heart attack/  also states had myoview Jan 2016 w/ PCP and it was normal  . History of chest pain   . History of chronic bronchitis   . Prediabetes   . Lower urinary tract symptoms (LUTS)   . Gross hematuria   . History of kidney stones   . History of primary genital syphilis     1997-- treated w/ penicillin injection's  . Prostate CA ([place])     hx of prostate CA per pt    Past Surgical History  Procedure Laterality Date  . No past surgeries    . Cystoscopy N/A 02/17/2015    Procedure: CYSTOSCOPY;  Surgeon: Lowella Bandy, MD;  Location: [facility];  Service: Urology;  Laterality: N/A;    There were no vitals filed for this visit.  Visit Diagnosis:  Right-sided muscle weakness  Right arm numbness  Paresthesias in right hand      Subjective Assessment - 08/08/15 0939    Subjective  "I just want to go back to work"  Pt reports that he is not sure what caused weakness/numbness   Pertinent History pt reports hx of prostate CA and old back injury, hx of Raynaud's   Patient Stated  Goals Return to work.  Pt reports that he feels as though his RLE is weak and numb but questions if his hand is at baseline due to his h/o Raynaud's. Increase strength overall.   Currently in Pain? Yes   Pain Score 5    Pain Location Back   Pain Descriptors / Indicators Aching;Dull   Pain Type Acute pain   Aggravating Factors  unknown    Pain Relieving Factors muscle relaxer, heat            [facility] OT Assessment - 08/08/15 0001    ROM / Strength   AROM / PROM / Strength Strength   Strength   Overall Strength Deficits   Overall Strength Comments RUE shoulder flex 4-/5, abduction 4+/5, horizontal adduction/abduction 4+/5, biceps/triceps 5/5                          OT Education - 08/08/15 1039    Education Details Green putty HEP and red theraband HEP--see pt instructions   Person(s) Educated Patient   Methods Explanation;Verbal cues;Handout;Demonstration   Comprehension Verbalized understanding;Returned demonstration          OT Short Term Goals - 08/03/15 1312    OT SHORT TERM GOAL #1   Title Pt  will be I HEP for RUE strengthening    Time 3   Period Weeks   Status New   OT SHORT TERM GOAL #2   Title Pt will tolerate 13min Bilateral UE ther ex/conditioning w/o a rest break   Time 3   Period Weeks   Status New           OT Long Term Goals - 08/03/15 1315    OT LONG TERM GOAL #1   Title Pt will be I upgraded HEP RUE strengthening   Time 6   Period Weeks   Status New   OT LONG TERM GOAL #2   Title Pt will demonstrate increase R hand grip strength by 10# or greater as seen by Grip testing   Baseline R = 38, 40# (vs L 110#)   Time 6   Period Weeks   Status New   OT LONG TERM GOAL #3   Title Pt will demonstrate increased coordination Right hand as seen by improved 9 hole peg test score by 3-5 seconds or more   Baseline 22.10 seconds right   Time 6   Period Weeks   Status New   OT LONG TERM GOAL #4   Title Pt will demonstrate increased  endurance as seen by ability to participate in 79min therapy session for R UE strengthening nad coordination w/ 1 rest break or less    Time 6   Period Weeks   Status New               Plan - 08/08/15 2103    Clinical Impression Statement Pt continues to demo inconsistent RUE strength testing (per MMT).  Pt did not bring HEP from acute; however, pt instructed in updated/upgraded HEP and pt returned demo updated HEP for RUE strengthening.     Plan review HEP, RUE strengthening, UBE   OT Home Exercise Plan Education issued:  08/08/15 green putty, red theraband HEP   Consulted and Agree with Plan of Care Patient        Problem List Patient Active Problem List   Diagnosis Date Noted  . 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 Right sided weakness 07/09/2015  . HTN (hypertension) 07/09/2015  . GERD (gastroesophageal reflux disease) 07/09/2015  . WEIGHT LOSS 03/28/2009  . GERD 09/22/2008  . POLYURIA 09/22/2008  . VIRAL INFECTION 07/01/2007  . PHARYNGITIS 07/01/2007  . HEMORRHOIDS, EXTERNAL 06/12/2007  . BACK PAIN, CHRONIC 06/11/2007  . SNORING 06/11/2007  . GONORRHEA 03/13/2007  . 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 FATIGUE 09/12/2006  . CHEST PAIN, NON-CARDIAC 09/18/2004  . SYPHILIS 10/08/1995    [facility] 08/08/2015, 9:11 PM  [facility] [address] [place], [place], [address] Phone: [phone]   Fax:  [phone]  Name: LUGENE BEOUGHER MRN: [id number] Date of Birth: [birth year]  Vianne Bulls, OTR/L 08/08/2015 9:11 PM

## 2015-08-08 NOTE — Patient Instructions (Addendum)
1. Grip Strengthening (Resistive Putty)   Squeeze putty using thumb and all fingers. Repeat 25 times. Do 1-2 sessions per day.   Extension (Assistive Putty)   Roll putty back and forth, being sure to use all fingertips. Repeat 3 times. Do 1-2 sessions per day.  Then pinch as below.   Palmar Pinch Strengthening (Resistive Putty)   Pinch putty between thumb and each fingertip in turn after rolling out    Finger and Thumb Extension (Resistive Putty)   With thumb and all fingers in center of putty donut, stretch out. Repeat 10 times. Do 1 sessions per day.      MP Flexion (Resistive Putty)   Bending only at large knuckles, press putty down against thumb. Keep fingertips straight. Repeat 10 times. Do 1-2 sessions per day.   FINGERS: Extension (Putty)    Open hand and fingers to flatten putty. 5-10 reps per set, 1-2 sets per day   Copyright  VHI. All rights reserved.  FINGERS: Flexion (Putty)    Place hand over flat putty. Gather putty into a ball. 5-10 reps per set, 1-2sets per day   Copyright  VHI. All rights reserved.  Lateral Pinch Strengthening (Resistive Putty)    Squeeze between thumb and side of each finger in turn. Repeat 10 times. Do 1-2 sessions per day.  Copyright  VHI. All rights reserved.    Strengthening: Resisted Flexion   Attach tube to door.  Hold tubing with one arm at side. Pull forward and up. Move shoulder through pain-free range of motion. Repeat 10-15 times per set.  Do 1-2 sessions per day.    Strengthening: Resisted Extension   Attach one end to door.  Hold tubing in one hand, arm forward. Pull arm back, elbow straight. Repeat 10-15 times per set. Do 1-2 sessions per day.   Resisted Horizontal Abduction: Bilateral   Sit or stand, tubing in both hands, arms out in front. Keeping arms straight, pinch shoulder blades together and stretch arms out. Repeat 15 times per set.  Do 1-2 sessions per day     ABDUCTION:  Sitting - Resistance Band (Active)    Sit with right arm down and palm up. Against red resistance band, lift arm out to side and up as high as possible, keeping elbow straight. Complete 10-15 repetitions. Perform 1-2 sessions per day.  Copyright  VHI. All rights reserved.

## 2015-08-08 NOTE — Therapy (Signed)
[facility] [address] [place], [place], [address] Phone: [phone]   Fax:  [phone]  Physical Therapy Treatment  Patient Details  Name: George Gonzales MRN: [id number] Date of Birth: 1976-01-30 Referring Provider: Nolene Ebbs, MD  Encounter Date: 08/08/2015      PT End of Session - 08/08/15 2113    Visit Number 2   Number of Visits 7   Date for PT Re-Evaluation 09/17/15   Authorization Type UHC   PT Start Time 0923  Pt arrived late to session   PT Stop Time 0935   PT Time Calculation (min) 41 min   Equipment Utilized During Treatment Gait belt   Activity Tolerance Patient limited by pain;Patient limited by fatigue   Behavior During Therapy [facility] for tasks assessed/performed      Past Medical History  Diagnosis Date  . Raynaud's syndrome   . Hypertension   . GERD (gastroesophageal reflux disease)   . History of MI (myocardial infarction)     pt states at age 55 had Stress-induced heart attack/  also states had myoview Jan 2016 w/ PCP and it was normal  . History of chest pain   . History of chronic bronchitis   . Prediabetes   . Lower urinary tract symptoms (LUTS)   . Gross hematuria   . History of kidney stones   . History of primary genital syphilis     1997-- treated w/ penicillin injection's  . Prostate CA ([place])     hx of prostate CA per pt    Past Surgical History  Procedure Laterality Date  . No past surgeries    . Cystoscopy N/A 02/17/2015    Procedure: CYSTOSCOPY;  Surgeon: Lowella Bandy, MD;  Location: [facility];  Service: Urology;  Laterality: N/A;    There were no vitals filed for this visit.  Visit Diagnosis:  Abnormality of gait  Weakness of right lower extremity      Subjective Assessment - 08/08/15 0858    Subjective "I think I'm doing better. I walked without the cane almost all day yesterday. 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 Yes   Pain Score 6    Pain Location Back   Pain Orientation Right   Pain Descriptors / Indicators Dull;Aching  "dull knife"   Pain Type Acute pain   Pain Radiating Towards into anterior aspect of R thigh; sometimes in posteriorn aspect of RLE (per pt description)   Pain Onset Yesterday   Pain Frequency Intermittent   Aggravating Factors  "I don't know"   Pain Relieving Factors "Sometimes medicine; hydrocodone"            [facility] PT Assessment - 08/08/15 0001    6 minute walk test results    Aerobic Endurance Distance Walked 620  with SBQC                     [facility] Adult PT Treatment/Exercise - 08/08/15 0001    Transfers   Transfers Sit to Stand;Stand to Sit   Sit to Stand 7: Independent   Stand to Sit 7: Independent   Ambulation/Gait   Ambulation/Gait Assistance 5: Supervision;4: Min guard   Ambulation/Gait Assistance Details Cueing focused on safe/appropriate use of SBQC with  inconsistent within-session carryover.   Ambulation Distance (Feet) 620 Feet  6MWT   Assistive device Small based quad cane   Gait Pattern Step-through pattern;Decreased hip/knee flexion - right;Decreased dorsiflexion - right;Decreased weight shift to right;Decreased trunk rotation;Trunk flexed;Decreased stance time - right;Right foot flat;Poor foot clearance - right   Ambulation Surface Level;Indoor   Stairs Yes   Stairs Assistance 5: Supervision   Stairs Assistance Details (indicate cue type and reason) When asked to demonstate how pt negotiates stairs at home, pt hooked cane to belt loop, grabbed onto both rails, and jumped over 3 stairs at one time with BLE's. Therefore, this PT explained and demonstrated lateral negotiate of stairs with BUE support at R rail with effective return demonstration from pt.   Stair Management Technique  Sideways;Step to pattern;One rail Right   Number of Stairs 6   Height of Stairs 4   Standardized Balance Assessment   Standardized Balance Assessment Berg Balance Test   Berg Balance Test   Sit to Stand Able to stand without using hands and stabilize independently   Standing Unsupported Able to stand safely 2 minutes   Sitting with Back Unsupported but Feet Supported on Floor or Stool Able to sit safely and securely 2 minutes   Stand to Sit Sits safely with minimal use of hands   Transfers Able to transfer safely, minor use of hands   Standing Unsupported with Eyes Closed Able to stand 10 seconds safely   Standing Ubsupported with Feet Together Able to place feet together independently and stand 1 minute safely   From Standing, Reach Forward with Outstretched Arm Can reach confidently >25 cm (10")   From Standing Position, Pick up Object from Floor Able to pick up shoe safely and easily   From Standing Position, Turn to Look Behind Over each Shoulder Looks behind from both sides and weight shifts well   Turn 360 Degrees Able to turn 360 degrees safely but slowly   Standing Unsupported, Alternately Place Feet on Step/Stool Able to stand independently and safely and complete 8 steps in 20 seconds   Standing Unsupported, One Foot in Front Able to place foot tandem independently and hold 30 seconds   Standing on One Leg Able to lift leg independently and hold > 10 seconds  able to do so with BLE's   Total Score 54   Exercises   Exercises Other Exercises   Other Exercises  Attempted to educate pt on squats for functional LE strengthening; however, pt exhibiting instability, decreased safety when pt performed x8 reps.                PT Education - 08/08/15 2111    Education provided Yes   Education Details Initiated walking program. Armed forces operational officer.   Person(s) Educated Patient   Methods Explanation;Demonstration;Verbal cues;Handout   Comprehension Verbalized  understanding;Returned demonstration          PT Short Term Goals - 08/08/15 0913    PT SHORT TERM GOAL #1   Title Pt will perform initial HEP with mod I using paper handout to maximize functional gains made in PT. Target date: 08/24/15   PT SHORT TERM GOAL #2   Title Pt will improve gait velocity from 1.46 ft/sec to >/= 1.8 ft/sec to indicate decreased risk of recurrent falls.  Target date: 08/24/15   PT SHORT TERM GOAL #3   Title Complete 6MWT to assess/address functional endurance and improve 6MWT distance by 56' from baseline to progress toward improved functional endurance.  Target date: 08/24/15   Baseline 11/1: 6MWT baseline = 620' with [facility]           PT Long Term Goals - 08/03/15 1343    PT LONG TERM GOAL #1   Title Pt will verbalize understanding of fall prevention strategies to decrease risk of falls in home environment. Target date: 09/14/15   PT LONG TERM GOAL #2   Title Pt will negotiate 3 flights of stairs with 2 rails with mod I while safely managing assistive device to indicate pt ability to safely enter/exit current residence. Target date: 09/14/15   PT LONG TERM GOAL #3   Title Pt will improve 6MWT distance by 113' from baseline to indicate significant improvement in functional activity tolerance. Target date: 09/14/15   PT LONG TERM GOAL #4   Title Pt will increase gait velocity from 1.46 ft/sec to > / = 2.06 ft/sec to indicate increased efficiency of ambulation. Target date: 09/14/15               Plan - 08/08/15 2135    Clinical Impression Statement Pt scored 54/56 on Berg Balance Scale, suggesting no fall risk. However, pt continues to exihbit significant gait deviations and limited walking tolerance, as exhibited by 6MWT disance of 620'. When asked to demonstate how pt negotiates stairs at home, pt hooked cane to belt loop, grabbed onto both rails, and jumped over 3 stairs at one time with BLE's   Pt will benefit from skilled therapeutic intervention in  order to improve on the following deficits Abnormal gait;Decreased activity tolerance;Decreased endurance;Decreased balance;Decreased knowledge of use of DME;Postural dysfunction;Pain;Other (comment);Decreased strength;Decreased mobility  Pain will be monitored but not directly addressed during PT due to nature of pain   Rehab Potential Fair   Clinical Impairments Affecting Rehab Potential inconsistent findings on PT assessment; no significant findings on CT/MRI   PT Frequency 1x / week   PT Duration 6 weeks   PT Treatment/Interventions ADLs/Self Care Home Management;Gait training;Stair training;Functional mobility training;Therapeutic activities;Therapeutic exercise;Balance training;Neuromuscular re-education;Patient/family education;Manual techniques;Vestibular   PT Next Visit Plan Ask about compliance with walking program. Add home exercises for LE strengthening.   Consulted and Agree with Plan of Care Patient        Problem List Patient Active Problem List   Diagnosis Date Noted  . 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 Right sided weakness 07/09/2015  . HTN (hypertension) 07/09/2015  . GERD (gastroesophageal reflux disease) 07/09/2015  . WEIGHT LOSS 03/28/2009  . GERD 09/22/2008  . POLYURIA 09/22/2008  . VIRAL INFECTION 07/01/2007  . PHARYNGITIS 07/01/2007  . HEMORRHOIDS, EXTERNAL 06/12/2007  . BACK PAIN, CHRONIC 06/11/2007  . SNORING 06/11/2007  . GONORRHEA 03/13/2007  . 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 FATIGUE 09/12/2006  . CHEST PAIN, NON-CARDIAC 09/18/2004  . SYPHILIS 10/08/1995    Billie Ruddy, PT, DPT [facility] [address] [place], [place], [address] Phone: [phone]   Fax:  [phone] 08/08/2015, 9:38 PM   Name: George Gonzales MRN: [id number] Date of Birth: 03/01/1976

## 2015-08-14 ENCOUNTER — Ambulatory Visit: Payer: 59 | Admitting: Physical Therapy

## 2015-08-14 ENCOUNTER — Ambulatory Visit: Payer: 59 | Admitting: Occupational Therapy

## 2015-08-24 ENCOUNTER — Ambulatory Visit: Payer: 59 | Admitting: Physical Therapy

## 2015-08-24 ENCOUNTER — Encounter: Payer: Self-pay | Admitting: Occupational Therapy

## 2015-08-24 ENCOUNTER — Ambulatory Visit: Payer: 59 | Admitting: Occupational Therapy

## 2015-08-24 DIAGNOSIS — R29898 Other symptoms and signs involving the musculoskeletal system: Secondary | ICD-10-CM

## 2015-08-24 DIAGNOSIS — R269 Unspecified abnormalities of gait and mobility: Secondary | ICD-10-CM

## 2015-08-24 DIAGNOSIS — R2681 Unsteadiness on feet: Secondary | ICD-10-CM

## 2015-08-24 DIAGNOSIS — R200 Anesthesia of skin: Secondary | ICD-10-CM

## 2015-08-24 DIAGNOSIS — M6281 Muscle weakness (generalized): Secondary | ICD-10-CM

## 2015-08-24 NOTE — Therapy (Signed)
[facility] [address] [place], [place], [address] Phone: [phone]   Fax:  [phone]  Physical Therapy Treatment  Patient Details  Name: George Gonzales MRN: [id number] Date of Birth: Jun 08, 1976 Referring Provider: Nolene Ebbs, MD  Encounter Date: 08/24/2015      PT End of Session - 08/24/15 2009    Visit Number 3   Number of Visits 7   Date for PT Re-Evaluation 09/17/15   Authorization Type UHC   PT Start Time 0850   PT Stop Time 0930   PT Time Calculation (min) 40 min   Activity Tolerance Patient tolerated treatment well   Behavior During Therapy [facility] for tasks assessed/performed      Past Medical History  Diagnosis Date  . Raynaud's syndrome   . Hypertension   . GERD (gastroesophageal reflux disease)   . History of MI (myocardial infarction)     pt states at age 39 had Stress-induced heart attack/  also states had myoview Jan 2016 w/ PCP and it was normal  . History of chest pain   . History of chronic bronchitis   . Prediabetes   . Lower urinary tract symptoms (LUTS)   . Gross hematuria   . History of kidney stones   . History of primary genital syphilis     1997-- treated w/ penicillin injection's  . Prostate CA ([place])     hx of prostate CA per pt    Past Surgical History  Procedure Laterality Date  . No past surgeries    . Cystoscopy N/A 02/17/2015    Procedure: CYSTOSCOPY;  Surgeon: Lowella Bandy, MD;  Location: [facility]  Service: Urology;  Laterality: N/A;    There were no vitals filed for this visit.  Visit Diagnosis:  Abnormality of gait  Weakness of right lower extremity  Unsteadiness      Subjective Assessment - 08/24/15 0855    Subjective Pt arrived to session without cane. States, "The doctor released me to go back to work. Also, I've been going to the gym and doing the stairs like you told me to." Pt reports that MD "took me off of all my medication except  for the BP medicine and another one, as needed." Pt reports he doesn't feel as weak.   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 No/denies                         [facility] Adult PT Treatment/Exercise - 08/24/15 2004    Transfers   Transfers Sit to Stand   Sit to Stand 7: Independent   Five time sit to stand comments  without UE support   Stand to Sit 7: Independent   Transfer Cueing without UE support; cueing for slow, controlled descent   Ambulation/Gait   Ambulation/Gait Assistance 6: Modified independent (Device/Increase time)  increased time   Ambulation Distance (Feet) 550 Feet   Assistive device None   Gait Pattern Step-through pattern;Decreased dorsiflexion - right;Decreased weight shift to right;Trunk flexed;Decreased stance time - right;Right foot flat   Ambulation Surface Level;Indoor   Stairs Yes   Stairs Assistance 7: Independent   Stairs Assistance Details (indicate cue type and reason) with effective betweeb-session carryover of lateral stair negotiation pattern taught at previous session   Stair Management Technique One rail Right;Step to pattern;Sideways  Number of Stairs 36  x3 flights to simulate home environment   Height of Stairs 6   Exercises   Exercises Other Exercises   Other Exercises  Initiated HEP with focus on functional LE strengthening (emphasis on R hip) and gaze stabilization. With verbal and demonstration cueing from this PT, pt performed all exercises safely and effectively. See Pt Instructions for detailed description of exercises, reps (to pt fatigue), sets, freuqency, and duration.                PT Education - 08/24/15 2004    Education provided Yes   Education Details HEP for LE strengthening, gaze stabilization, and balance.   Person(s) Educated Patient   Methods Explanation;Demonstration;Verbal cues;Handout    Comprehension Verbalized understanding;Returned demonstration          PT Short Term Goals - 08/24/15 2011    PT SHORT TERM GOAL #1   Title Pt will perform initial HEP with mod I using paper handout to maximize functional gains made in PT. Target date: 08/24/15   Status On-going   PT SHORT TERM GOAL #2   Title Pt will improve gait velocity from 1.46 ft/sec to >/= 1.8 ft/sec to indicate decreased risk of recurrent falls.  Target date: 08/24/15   Status On-going   PT SHORT TERM GOAL #3   Title Complete 6MWT to assess/address functional endurance and improve 6MWT distance by 56' from baseline to progress toward improved functional endurance.  Target date: 08/24/15   Baseline 11/1: 6MWT baseline = 620' with SBQC   Status Partially Met           PT Long Term Goals - 08/24/15 2010    PT LONG TERM GOAL #1   Title Pt will verbalize understanding of fall prevention strategies to decrease risk of falls in home environment. Target date: 09/14/15   PT LONG TERM GOAL #2   Title Pt will negotiate 3 flights of stairs with 2 rails with mod I while safely managing assistive device to indicate pt ability to safely enter/exit current residence. Target date: 09/14/15   Baseline Met 11/17.   Status Achieved   PT LONG TERM GOAL #3   Title Pt will improve 6MWT distance by 113' from baseline to indicate significant improvement in functional activity tolerance. Target date: 09/14/15   PT LONG TERM GOAL #4   Title Pt will increase gait velocity from 1.46 ft/sec to > / = 2.06 ft/sec to indicate increased efficiency of ambulation. Target date: 09/14/15               Plan - 08/24/15 0930    Clinical Impression Statement Skilled session focused on initiating safe LE strengthening HEP. Pt with improved safety awareness during LE exercises and sair negotiation. LTG met for negotiation of 3 flights of stairs.   Pt will benefit from skilled therapeutic intervention in order to improve on the following  deficits Abnormal gait;Decreased activity tolerance;Decreased endurance;Decreased balance;Decreased knowledge of use of DME;Postural dysfunction;Pain;Other (comment);Decreased strength;Decreased mobility  Pain will be monitored but not directly addressed by PT.   Rehab Potential Fair   Clinical Impairments Affecting Rehab Potential inconsistent findings on PT assessment; no significant findings on CT/MRI   PT Frequency 1x / week   PT Duration 6 weeks   PT Treatment/Interventions ADLs/Self Care Home Management;Gait training;Stair training;Functional mobility training;Therapeutic activities;Therapeutic exercise;Balance training;Neuromuscular re-education;Patient/family education;Manual techniques;Vestibular   PT Next Visit Plan Finish checking STG's.  Plan to DC if LTG's met.   Consulted and Agree with   Plan of Care Patient        Problem List Patient Active Problem List   Diagnosis Date Noted  . 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 Right sided weakness 07/09/2015  . HTN (hypertension) 07/09/2015  . GERD (gastroesophageal reflux disease) 07/09/2015  . WEIGHT LOSS 03/28/2009  . GERD 09/22/2008  . POLYURIA 09/22/2008  . VIRAL INFECTION 07/01/2007  . PHARYNGITIS 07/01/2007  . HEMORRHOIDS, EXTERNAL 06/12/2007  . BACK PAIN, CHRONIC 06/11/2007  . SNORING 06/11/2007  . GONORRHEA 03/13/2007  . 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 FATIGUE 09/12/2006  . CHEST PAIN, NON-CARDIAC 09/18/2004  . SYPHILIS 10/08/1995    Blair Hobble, PT, DPT [facility] [address] [place], [place], [address] Phone: [phone]   Fax:  [phone] 08/24/2015, 8:13 PM  Name: George Gonzales MRN: [id number] Date of Birth: 11/16/1975     

## 2015-08-24 NOTE — Therapy (Signed)
[facility] [address] [place], [place], [address] Phone: [phone]   Fax:  [phone]  Occupational Therapy Treatment  Patient Details  Name: George Gonzales MRN: [id number] Date of Birth: 1975/12/01 No Data Recorded  Encounter Date: 08/24/2015      OT End of Session - 08/24/15 0944    Visit Number 3   Number of Visits 6   Date for OT Re-Evaluation 09/28/15   Authorization Type UHC - Check authorization/coverage   OT Start Time [phone]   OT Stop Time 1015   OT Time Calculation (min) 38 min   Activity Tolerance Patient tolerated treatment well   Behavior During Therapy [facility] for tasks assessed/performed      Past Medical History  Diagnosis Date  . Raynaud's syndrome   . Hypertension   . GERD (gastroesophageal reflux disease)   . History of MI (myocardial infarction)     pt states at age 39 had Stress-induced heart attack/  also states had myoview Jan 2016 w/ PCP and it was normal  . History of chest pain   . History of chronic bronchitis   . Prediabetes   . Lower urinary tract symptoms (LUTS)   . Gross hematuria   . History of kidney stones   . History of primary genital syphilis     1997-- treated w/ penicillin injection's  . Prostate CA ([place])     hx of prostate CA per pt    Past Surgical History  Procedure Laterality Date  . No past surgeries    . Cystoscopy N/A 02/17/2015    Procedure: CYSTOSCOPY;  Surgeon: Lowella Bandy, MD;  Location: [facility];  Service: Urology;  Laterality: N/A;    There were no vitals filed for this visit.  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  Pt reports mild blurriness with being on computer all day and that PT updated his hospital eye exercises.  Pt also reports that  he has been going to the gym.   Pertinent History pt reports hx of prostate CA and old back injury, hx of Raynaud's   Patient Stated Goals Return to work.  Pt reports that he feels as though his RLE is weak and numb but questions if his hand is at baseline due to his h/o Raynaud's. Increase strength overall.   Currently in Pain? No/denies                      OT Treatments/Exercises (OP) - 08/24/15 0001    ADLs   Overall ADLs began checking goals and discussing progress/9-hole peg test--see goal section.     Exercises   Exercises  Arm bike x90mn level 5 w/goal of 50-70rpms, pt able to do so   Hand Exercises   Other Hand Exercises Picking up blocks using 75lbs sustained grip strength with min difficulty/drops.   Functional Reaching Activities   High Level to place small pegs in vertical pegboard to copy design with 2lb weight on each wrist.  Pt performed in standing and alternated UEs for reaching.                  OT Education - 08/24/15 0955    Education Details Reviewed theraband HEP and upgraded to green.  Person(s) Educated Patient   Methods Explanation;Demonstration   Comprehension Verbalized understanding;Returned demonstration          OT Short Term Goals - 08/24/15 0951    OT SHORT TERM GOAL #1   Title Pt will be I HEP for RUE strengthening    Time 3   Period Weeks   Status Achieved   OT SHORT TERM GOAL #2   Title Pt will tolerate 68mn Bilateral UE ther ex/conditioning w/o a rest break   Time 3   Period Weeks   Status Achieved           OT Long Term Goals - 08/24/15 0941    OT LONG TERM GOAL #1   Title Pt will be I upgraded HEP RUE strengthening   Time 6   Period Weeks   Status New   OT LONG TERM GOAL #2   Title Pt will demonstrate increase R hand grip strength by 10# or greater as seen by Grip testing   Baseline R = 38, 40# (vs L 110#)   Time 6   Period Weeks   Status Achieved  08/24/15:  95lbs   OT LONG TERM GOAL #3    Title Pt will demonstrate increased coordination Right hand as seen by improved 9 hole peg test score by 3-5 seconds or more   Baseline 22.10 seconds right   Time 6   Period Weeks   Status On-going  08/24/15:  20.72, 21.75sec   OT LONG TERM GOAL #4   Title Pt will demonstrate increased endurance as seen by ability to participate in 448m therapy session for R UE strengthening nad coordination w/ 1 rest break or less    Time 6   Period Weeks   Status New               Plan - 08/24/15 1302    Clinical Impression Statement Pt improving with RUE strength and endurance.  Pt met STGs and met LTG#2 and is approximating remaining goals.  Pt reports that he has returned to work   Plan continue with RUE strength/endurance, ?d/c in 1-2 visits   OT Home Exercise Plan Education issued:  08/08/15 green putty, red theraband HEP   Consulted and Agree with Plan of Care Patient        Problem List Patient Active Problem List   Diagnosis Date Noted  . 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 Right sided weakness 07/09/2015  . HTN (hypertension) 07/09/2015  . GERD (gastroesophageal reflux disease) 07/09/2015  . WEIGHT LOSS 03/28/2009  . GERD 09/22/2008  . POLYURIA 09/22/2008  . VIRAL INFECTION 07/01/2007  . PHARYNGITIS 07/01/2007  . HEMORRHOIDS, EXTERNAL 06/12/2007  . BACK PAIN, CHRONIC 06/11/2007  . SNORING 06/11/2007  . GONORRHEA 03/13/2007  . 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 FATIGUE 09/12/2006  . CHEST PAIN, NON-CARDIAC 09/18/2004  . SYPHILIS 10/08/1995    [facility]10/23/2014, 1:24 PM  [address]one: [phone] Fax:  [phone]: PhKYL GIVLERRN: [id number] of Birth: 11/16/00/1977 AnVianne BullsOTR/L 08/24/2015 1:24 PM

## 2015-08-24 NOTE — Patient Instructions (Addendum)
Hip Flexor Stretch - RIGHT    Lying on back with right leg near edge of bed. Bend left knee. Hang RIGHT leg over edge; make sure thigh is off edge. You should feel a gentle stretch in the front of your right thigh. Hold for 60 seconds. Perform this stretch twice per day on the right leg.   Bridging (Single Leg) - RIGHT    Lie on back with feet shoulder width apart and LEFT leg straight. Lift hips toward the ceiling while keeping leg straight. Hold _2__ seconds, then slowly lower hips back to starting position. Perform 6 repetitions, 4 times per day with the RIGHT leg.     Hip Flexion / Knee Extension: Straight-Leg Raise (Eccentric) - RIGHT   Lie on back. Bent left knee. While keeping your knee straight, raise you RIGHT leg (as pictured). Slowly lower leg for 3-5 seconds. _20__ reps per set, _3__ sets per day, _5__ days per week.   Functional Quadriceps: Sit to Stand    Sit on edge of chair, feet flat on floor. Stand upright, extending knees fully. Repeat __10__ times per set. Do __3_ sets per day.   Gaze Stabilization: Tip Card  1.Target must remain in focus, not blurry, and appear stationary while head is in motion. 2.Perform exercises with small head movements (45 to either side of midline). 3.Increase speed of head motion so long as target is in focus. 4.If you wear eyeglasses, be sure you can see target through lens (therapist will give specific instructions for bifocal / progressive lenses). 5.These exercises may provoke dizziness or nausea. Work through these symptoms. If too dizzy, slow head movement slightly. Rest between each exercise. 6.Exercises demand concentration; avoid distractions. 7.For safety, perform standing exercises close to a counter, wall, corner, or next to someone.  Gaze Stabilization: Standing Feet Apart    Place a stable chair in front of you just in case you lose your balance (try not to hold onto chair unless you need to). Feet shoulder width  apart, keeping eyes on target on wall __6_ feet away, tilt head down 15-30 and move head side to side for __30__ seconds. Repeat while moving head up and down for _30_ seconds. Do _2___ sessions per day.

## 2015-08-29 ENCOUNTER — Ambulatory Visit: Payer: 59 | Admitting: Occupational Therapy

## 2015-08-29 ENCOUNTER — Ambulatory Visit: Payer: 59 | Admitting: Physical Therapy

## 2015-08-29 DIAGNOSIS — R269 Unspecified abnormalities of gait and mobility: Secondary | ICD-10-CM

## 2015-08-29 DIAGNOSIS — M6281 Muscle weakness (generalized): Secondary | ICD-10-CM

## 2015-08-29 NOTE — Therapy (Signed)
[facility] [address] [place], [place], [address] Phone: [phone]   Fax:  [phone]  Physical Therapy Treatment  Patient Details  Name: George Gonzales MRN: [id number] Date of Birth: June 01, 1976 Referring Provider: Nolene Ebbs, MD  Encounter Date: 08/29/2015      PT End of Session - 08/29/15 2206    Visit Number 4   Number of Visits 7   Date for PT Re-Evaluation 09/17/15   Authorization Type UHC   PT Start Time 1638   PT Stop Time 1400   PT Time Calculation (min) 41 min   Activity Tolerance Patient tolerated treatment well;No increased pain   Behavior During Therapy [facility] for tasks assessed/performed      Past Medical History  Diagnosis Date  . Raynaud's syndrome   . Hypertension   . GERD (gastroesophageal reflux disease)   . History of MI (myocardial infarction)     pt states at age 39 had Stress-induced heart attack/  also states had myoview Jan 2016 w/ PCP and it was normal  . History of chest pain   . History of chronic bronchitis   . Prediabetes   . Lower urinary tract symptoms (LUTS)   . Gross hematuria   . History of kidney stones   . History of primary genital syphilis     1997-- treated w/ penicillin injection's  . Prostate CA ([place])     hx of prostate CA per pt    Past Surgical History  Procedure Laterality Date  . No past surgeries    . Cystoscopy N/A 02/17/2015    Procedure: CYSTOSCOPY;  Surgeon: Lowella Bandy, MD;  Location: [facility];  Service: Urology;  Laterality: N/A;    There were no vitals filed for this visit.  Visit Diagnosis:  Abnormality of gait      Subjective Assessment - 08/29/15 1325    Subjective "The pain just went away a few days ago. As I do these exercises, the pain gets less and less." Pt reports ongoing limitation in walking tolerance; however, pt has not been performing walking program provided at initial PT session. Pt agreeable to plan to  discharge from PT today.   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 No/denies                         [facility] Adult PT Treatment/Exercise - 08/29/15 2202    Transfers   Transfers Sit to Stand   Sit to Stand 7: Independent   Five time sit to stand comments  without UE support   Stand to Sit 7: Independent   Ambulation/Gait   Ambulation/Gait Assistance 7: Independent  increased time   Ambulation/Gait Assistance Details Gait pattern grossly WNL for initial 5 minutes of 6MWT; during final minute of 6MWT, noted decreased RLE stance time and limited LLE step length.   Ambulation Distance (Feet) 1194 Feet  on 6MWT   Assistive device None   Gait Pattern Step-through pattern;Decreased dorsiflexion - right;Decreased weight shift to right;Decreased stance time - right;Right foot flat   Ambulation Surface Level;Indoor   Gait velocity 3.2   Exercises   Exercises Other Exercises   Other Exercises  Pt performed home exercises using paper handout and requiring 25% cueing for proper technique. Progressed home exercises, as appropriate. See Pt Instructions for detailed on  all exercies, reps (to pt fatigue), sets, frequency, and duration.                PT Education - 08/29/15 2200    Education provided Yes   Education Details Goals, findings, progress, and DC plan. HEP progressed. Fall prevention strategies. Reiterated education on walking program.   Person(s) Educated Patient   Methods Explanation;Demonstration;Handout   Comprehension Verbalized understanding;Returned demonstration          PT Short Term Goals - 08/29/15 1344    PT SHORT TERM GOAL #1   Title Pt will perform initial HEP with mod I using paper handout to maximize functional gains made in PT. Target date: 08/24/15   Baseline 11/22: Able to perofrm effectively with 25% cueing and with use of paper handout.    Status Partially Met   PT SHORT TERM GOAL #2   Title Pt will improve gait velocity from 1.46 ft/sec to >/= 1.8 ft/sec to indicate decreased risk of recurrent falls.  Target date: 08/24/15   Baseline 11/22: gait velocity = 3.2 ft/sec   Status Achieved   PT SHORT TERM GOAL #3   Title Complete 6MWT to assess/address functional endurance and improve 6MWT distance by 56' from baseline to progress toward improved functional endurance.  Target date: 08/24/15   Baseline 11/1: 6MWT baseline = 620' with SBQC. 11/22: 6MWT distance = 1,194' without AD   Status Achieved           PT Long Term Goals - 08/29/15 1343    PT LONG TERM GOAL #1   Title Pt will verbalize understanding of fall prevention strategies to decrease risk of falls in home environment. Target date: 09/14/15   Baseline Met 11/22.   Status Achieved   PT LONG TERM GOAL #2   Title Pt will negotiate 3 flights of stairs with 2 rails with mod I while safely managing assistive device to indicate pt ability to safely enter/exit current residence. Target date: 09/14/15   Baseline Met 11/17.   Status Achieved   PT LONG TERM GOAL #3   Title Pt will improve 6MWT distance by 113' from baseline to indicate significant improvement in functional activity tolerance. Target date: 09/14/15   Baseline 11/1: 6MWT baseline = 620' with SBQC.  11/22: 6MWT distance = 1194' without AD   Status Achieved   PT LONG TERM GOAL #4   Title Pt will increase gait velocity from 1.46 ft/sec to > / = 2.06 ft/sec to indicate increased efficiency of ambulation. Target date: 09/14/15   Baseline 11/22: gait velocity = 3.2 ft/sec   Status Achieved               Plan - 08/29/15 2207    Clinical Impression Statement Pt has met all short and long term goals (with exception of HEP performance, as pt required 25% cueing and use of paper handout), reports no pain, and exhibits no functional limitations. Thererefore, pt will be discharged from outpatient PT at this time. Pt  verbalized understanding and was in agreement with discharge plan.   Consulted and Agree with Plan of Care Patient        Problem List Patient Active Problem List   Diagnosis Date Noted  . 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 Right sided weakness 07/09/2015  . HTN (hypertension) 07/09/2015  . GERD (gastroesophageal reflux disease) 07/09/2015  . WEIGHT LOSS 03/28/2009  . GERD 09/22/2008  . POLYURIA 09/22/2008  . VIRAL INFECTION 07/01/2007  . PHARYNGITIS 07/01/2007  .  HEMORRHOIDS, EXTERNAL 06/12/2007  . BACK PAIN, CHRONIC 06/11/2007  . SNORING 06/11/2007  . GONORRHEA 03/13/2007  . 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 FATIGUE 09/12/2006  . CHEST PAIN, NON-CARDIAC 09/18/2004  . SYPHILIS 10/08/1995    PHYSICAL THERAPY DISCHARGE SUMMARY  Visits from Start of Care: 4  Current functional level related to goals / functional outcomes: See goals above.   Remaining deficits: Pt continues to exhibit antalgic gait pattern when fatigued; however, pt denies pain/paresthesia and exhibits no significant strength impairments consistent with gait pattern.   Education / Equipment: HEP and safe directions for progressing HEP; fall preventions strategies within home; walking program  Plan: Patient agrees to discharge.  Patient goals were met. Patient is being discharged due to meeting the stated rehab goals.  ?????       Billie Ruddy, PT, DPT [facility] [address] [place], [place], [address] Phone: [phone]   Fax:  [phone] 08/29/2015, 10:11 PM  Name: George Gonzales MRN: [id number] Date of Birth: 02-24-1976

## 2015-08-29 NOTE — Therapy (Signed)
[facility] [address] [place], [place], [address] Phone: [phone]   Fax:  [phone]  Occupational Therapy Treatment  Patient Details  Name: George Gonzales MRN: [id number] Date of Birth: [birth year] No Data Recorded  Encounter Date: 08/29/2015      OT End of Session - 08/29/15 1440    Visit Number 4   Number of Visits 6   Date for OT Re-Evaluation 09/28/15   Authorization Type UHC:  29 visit limit OT   OT Start Time 1407   OT Stop Time 1445   OT Time Calculation (min) 38 min   Activity Tolerance Patient tolerated treatment well   Behavior During Therapy [facility] for tasks assessed/performed      Past Medical History  Diagnosis Date  . Raynaud's syndrome   . Hypertension   . GERD (gastroesophageal reflux disease)   . History of MI (myocardial infarction)     pt states at age 18 had Stress-induced heart attack/  also states had myoview Jan 2016 w/ PCP and it was normal  . History of chest pain   . History of chronic bronchitis   . Prediabetes   . Lower urinary tract symptoms (LUTS)   . Gross hematuria   . History of kidney stones   . History of primary genital syphilis     1997-- treated w/ penicillin injection's  . Prostate CA ([place])     hx of prostate CA per pt    Past Surgical History  Procedure Laterality Date  . No past surgeries    . Cystoscopy N/A 02/17/2015    Procedure: CYSTOSCOPY;  Surgeon: Lowella Bandy, MD;  Location: [facility];  Service: Urology;  Laterality: N/A;    There were no vitals filed for this visit.  Visit Diagnosis:  Right-sided muscle weakness      Subjective Assessment - 08/29/15 1439    Subjective  Pt reports that he was able to lift heavy motor at work today.   Pertinent History pt reports hx of prostate CA and old back injury, hx of Raynaud's   Patient Stated Goals Return to work.  Pt reports that he feels as though his RLE is weak and numb but questions  if his hand is at baseline due to his h/o Raynaud's. Increase strength overall.   Currently in Pain? No/denies                      OT Treatments/Exercises (OP) - 08/29/15 0001    Exercises   Exercises --  Arm bike x64min level 7 without rest for conditioning   Hand Exercises   Other Hand Exercises Picking up blocks using 75lbs sustained grip strength without difficulty for incr activity tolerance   Functional Reaching Activities   High Level in standing to place/remove small pegs in vertical pegboard without rest and with 2lb wt on R wrist for incr RUE strength/activity tolerance     Picking up and carrying 30lb object approx 130ft without rest/LOB.  Then lifting 30lbs floor to mat x5 safely with min v.c. Initially for good body mechanics.  Checked remaining goals and discussed progress.  Pt agrees with d/c.--see goals section.         OT Education - 08/29/15 1437    Education Details Upgraded theraband HEP to blue   Person(s) Educated Patient   Methods Explanation   Comprehension Verbalized understanding;Returned demonstration          OT Short Term  Goals - 08/24/15 0951    OT SHORT TERM GOAL #1   Title Pt will be I HEP for RUE strengthening    Time 3   Period Weeks   Status Achieved   OT SHORT TERM GOAL #2   Title Pt will tolerate 30min Bilateral UE ther ex/conditioning w/o a rest break   Time 3   Period Weeks   Status Achieved           OT Long Term Goals - 08/29/15 1412    OT LONG TERM GOAL #1   Title Pt will be I upgraded HEP RUE strengthening   Time 6   Period Weeks   Status Achieved   OT LONG TERM GOAL #2   Title Pt will demonstrate increase R hand grip strength by 10# or greater as seen by Grip testing   Baseline R = 38, 40# (vs L 110#)   Time 6   Period Weeks   Status Achieved  08/24/15:  95lbs;  08/29/15  R 138lbs, L 135lbs   OT LONG TERM GOAL #3   Title Pt will demonstrate increased coordination Right hand as seen by improved 9  hole peg test score by 3-5 seconds or more   Baseline 22.10 seconds right   Time 6   Period Weeks   Status Achieved  08/24/15:  20.72, 21.75sec, 08/29/15  17.68sec   OT LONG TERM GOAL #4   Title Pt will demonstrate increased endurance as seen by ability to participate in 63min therapy session for R UE strengthening nad coordination w/ 1 rest break or less    Time 6   Period Weeks   Status Achieved  08/29/15, no rest breaks during RUE strengthening session                Plan - 08/29/15 1441    Clinical Impression Statement Pt met all goals and has made excellent progress.  Pt needed no rest breaks during session.   Plan d/c OT   OT Home Exercise Plan Education issued:  08/08/15 green putty, red theraband HEP   Consulted and Agree with Plan of Care Patient     OCCUPATIONAL THERAPY DISCHARGE SUMMARY  Visits from Start of Care: 4  Current functional level related to goals / functional outcomes: See above   Remaining deficits: Mild decr activity tolerance per pt but improved.  RUE strength and coordination now appear WNL.   Education / Equipment: Pt instructed in HEP.  Pt verbalized understanding.  Plan: Patient agrees to discharge.  Patient goals were met. Patient is being discharged due to meeting the stated rehab goals.  ?????        Problem List Patient Active Problem List   Diagnosis Date Noted  . 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 Right sided weakness 07/09/2015  . HTN (hypertension) 07/09/2015  . GERD (gastroesophageal reflux disease) 07/09/2015  . WEIGHT LOSS 03/28/2009  . GERD 09/22/2008  . POLYURIA 09/22/2008  . VIRAL INFECTION 07/01/2007  . PHARYNGITIS 07/01/2007  . HEMORRHOIDS, EXTERNAL 06/12/2007  . BACK PAIN, CHRONIC 06/11/2007  . SNORING 06/11/2007  . GONORRHEA 03/13/2007  . 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 FATIGUE 09/12/2006  . CHEST PAIN, NON-CARDIAC 09/18/2004  . SYPHILIS 10/08/1995     [facility] 08/29/2015, 5:25 PM  [facility] [address] [place], [place], [address] Phone: [phone]   Fax:  [phone]  Name: George Gonzales MRN: [id number] Date of Birth: [birth year]    George Gonzales,  OTR/L 08/29/2015 5:25 PM

## 2015-08-29 NOTE — Patient Instructions (Addendum)
Pelvic Rotation: Knee-to-Chest (Supine)      Lie on your back; bring your LEFT knee to your chest and hold it. Drop your RIGHT leg off the edge of your bed (as pictured). You should feel a gentle stretch in the front of your RIGHT thigh. Hold for 60 seconds, 2-3 times per day on the RIGHT leg.  Bridging (Single Leg) - RIGHT    Lie on back with feet shoulder width apart and LEFT leg straight. Lift hips toward the ceiling while keeping leg straight. Hold _1-2__ seconds, then slowly lower hips back to starting position. Perform 8 repetitions, 3 times per day with the RIGHT leg.   *When this gets easier, increase by 2-3 reps, as tolerated. When you're able to perform 25 reps without difficulty, place pillow under your right foot and start with 4 sets of 5 reps per day. Increase as tolerated.    Hip Flexion / Knee Extension: Straight-Leg Raise (Eccentric) - RIGHT   Lie on back. Bent left knee. While keeping your knee straight, raise you RIGHT leg (as pictured).  Slowly lower leg for 3-5 seconds. Make sure the back of your knee and the bak of you heel touch the bed at the same time on the way down.  _20__ reps per set, _3__ sets per day, _5__ days per week.   * When 20 reps becomes easy, increase by 2-3 reps at a time, as tolerated, until you're able to perform 30 reps consecutively.   Functional Quadriceps: Sit to Stand    Sit on edge of chair, feet flat on floor. Stand upright, extending knees fully. Repeat __12__ times per set. Do __3_ sets per day.  * When 12 reps becomes easy, increase by 2-3 reps at a time, as tolerated, until you're able to perform 30 reps consecutively.   Gaze Stabilization: Tip Card  1.Target must remain in focus, not blurry, and appear stationary while head is in motion. 2.Perform exercises with small head movements (45 to either side of midline). 3.Increase speed of head motion so long as target is in focus. 4.If you wear eyeglasses, be sure you can see  target through lens (therapist will give specific instructions for bifocal / progressive lenses). 5.These exercises may provoke dizziness or nausea. Work through these symptoms. If too dizzy, slow head movement slightly. Rest between each exercise. 6.Exercises demand concentration; avoid distractions. 7.For safety, perform standing exercises close to a counter, wall, corner, or next to someone.  Gaze Stabilization: Standing Feet Apart    Place a stable chair in front of you just in case you lose your balance (try not to hold onto chair unless you need to). Feet shoulder width apart, keeping eyes on target on wall __6_ feet away, tilt head down 15-30 and move head side to side for __60_ seconds. Do _2___ sessions per day.  When you're able to consistently perform for 60 consecutive seconds, increase by 15-second increments until you can do this for 2 minutes.  Walking Program:  Begin walking for exercise for 6 minutes, 1-2 times/day, 5 days/week.  Progress your walking program by adding 1 minute to your routine each week, as tolerated. Be sure to wear good walking shoes, walk in a safe environment, and only progress to your tolerance.   Fall Prevention in the Home  Falls can cause injuries and can affect people from all age groups. There are many simple things that you can do to make your home safe and to help prevent falls. WHAT CAN  I DO ON THE OUTSIDE OF MY HOME?  Regularly repair the edges of walkways and driveways and fix any cracks.  Remove high doorway thresholds.  Trim any shrubbery on the main path into your home.  Use bright outdoor lighting.  Clear walkways of debris and clutter, including tools and rocks.  Regularly check that handrails are securely fastened and in good repair. Both sides of any steps should have handrails.  Install guardrails along the edges of any raised decks or porches.  Have leaves, snow, and ice cleared regularly.  Use sand or salt on  walkways during winter months.  In the garage, clean up any spills right away, including grease or oil spills. WHAT CAN I DO IN THE BATHROOM?  Use night lights.  Install grab bars by the toilet and in the tub and shower. Do not use towel bars as grab bars.  Use non-skid mats or decals on the floor of the tub or shower.  If you need to sit down while you are in the shower, use a plastic, non-slip stool.[place]  Keep the floor dry. Immediately clean up any water that spills on the floor.  Remove soap buildup in the tub or shower on a regular basis.  Attach bath mats securely with double-sided non-slip rug tape.  Remove throw rugs and other tripping hazards from the floor. WHAT CAN I DO IN THE BEDROOM?  Use night lights.  Make sure that a bedside light is easy to reach.  Do not use oversized bedding that drapes onto the floor.  Have a firm chair that has side arms to use for getting dressed.  Remove throw rugs and other tripping hazards from the floor. WHAT CAN I DO IN THE KITCHEN?   Clean up any spills right away.  Avoid walking on wet floors.  Place frequently used items in easy-to-reach places.  If you need to reach for something above you, use a sturdy step stool that has a grab bar.  Keep electrical cables out of the way.  Do not use floor polish or wax that makes floors slippery. If you have to use wax, make sure that it is non-skid floor wax.  Remove throw rugs and other tripping hazards from the floor. WHAT CAN I DO IN THE STAIRWAYS?  Do not leave any items on the stairs.  Make sure that there are handrails on both sides of the stairs. Fix handrails that are broken or loose. Make sure that handrails are as long as the stairways.  Check any carpeting to make sure that it is firmly attached to the stairs. Fix any carpet that is loose or worn.  Avoid having throw rugs at the top or bottom of stairways, or secure the rugs with carpet tape to prevent them from  moving.  Make sure that you have a light switch at the top of the stairs and the bottom of the stairs. If you do not have them, have them installed. WHAT ARE SOME OTHER FALL PREVENTION TIPS?  Wear closed-toe shoes that fit well and support your feet. Wear shoes that have rubber soles or low heels.  When you use a stepladder, make sure that it is completely opened and that the sides are firmly locked. Have someone hold the ladder while you are using it. Do not climb a closed stepladder.  Add color or contrast paint or tape to grab bars and handrails in your home. Place contrasting color strips on the first and last steps.  Use mobility  aids as needed, such as canes, walkers, scooters, and crutches.  Turn on lights if it is dark. Replace any light bulbs that burn out.  Set up furniture so that there are clear paths. Keep the furniture in the same spot.  Fix any uneven floor surfaces.  Choose a carpet design that does not hide the edge of steps of a stairway.  Be aware of any and all pets.  Review your medicines with your healthcare provider. Some medicines can cause dizziness or changes in blood pressure, which increase your risk of falling. Talk with your health care provider about other ways that you can decrease your risk of falls. This may include working with a physical therapist or trainer to improve your strength, balance, and endurance.   This information is not intended to replace advice given to you by your health care provider. Make sure you discuss any questions you have with your health care provider.   Document Released: 09/13/2002 Document Revised: 02/07/2015 Document Reviewed: 10/28/2014 Elsevier Interactive Patient Education [employer].

## 2015-09-05 ENCOUNTER — Encounter: Payer: 59 | Admitting: Occupational Therapy

## 2015-09-05 ENCOUNTER — Ambulatory Visit: Payer: 59 | Admitting: Physical Therapy

## 2015-09-11 ENCOUNTER — Encounter: Payer: 59 | Admitting: Occupational Therapy

## 2015-09-11 ENCOUNTER — Ambulatory Visit: Payer: 59 | Admitting: Physical Therapy

## 2016-03-30 ENCOUNTER — Encounter (HOSPITAL_COMMUNITY): Payer: Self-pay | Admitting: Emergency Medicine

## 2016-03-30 ENCOUNTER — Ambulatory Visit (HOSPITAL_COMMUNITY)
Admission: EM | Admit: 2016-03-30 | Discharge: 2016-03-30 | Disposition: A | Discharge status: Home / Self Care | Payer: 59 | Attending: Emergency Medicine | Admitting: Emergency Medicine

## 2016-03-30 DIAGNOSIS — R319 Hematuria, unspecified: Secondary | ICD-10-CM

## 2016-03-30 DIAGNOSIS — Z87891 Personal history of nicotine dependence: Secondary | ICD-10-CM | POA: Insufficient documentation

## 2016-03-30 DIAGNOSIS — K219 Gastro-esophageal reflux disease without esophagitis: Secondary | ICD-10-CM | POA: Diagnosis not present

## 2016-03-30 DIAGNOSIS — R310 Gross hematuria: Secondary | ICD-10-CM | POA: Diagnosis present

## 2016-03-30 DIAGNOSIS — Z8546 Personal history of malignant neoplasm of prostate: Secondary | ICD-10-CM | POA: Insufficient documentation

## 2016-03-30 DIAGNOSIS — I10 Essential (primary) hypertension: Secondary | ICD-10-CM | POA: Diagnosis not present

## 2016-03-30 DIAGNOSIS — N451 Epididymitis: Secondary | ICD-10-CM | POA: Insufficient documentation

## 2016-03-30 DIAGNOSIS — Z87442 Personal history of urinary calculi: Secondary | ICD-10-CM | POA: Insufficient documentation

## 2016-03-30 DIAGNOSIS — I252 Old myocardial infarction: Secondary | ICD-10-CM | POA: Diagnosis not present

## 2016-03-30 LAB — POCT URINALYSIS DIP (DEVICE)
BILIRUBIN URINE: NEGATIVE
Glucose, UA: NEGATIVE mg/dL
KETONES UR: NEGATIVE mg/dL
Leukocytes, UA: NEGATIVE
Nitrite: NEGATIVE
PH: 6 (ref 5.0–8.0)
PROTEIN: NEGATIVE mg/dL
SPECIFIC GRAVITY, URINE: 1.02 (ref 1.005–1.030)
Urobilinogen, UA: 1 mg/dL (ref 0.0–1.0)

## 2016-03-30 MED ORDER — CEFTRIAXONE SODIUM 250 MG IJ SOLR
250.0000 mg | Freq: Once | INTRAMUSCULAR | Status: AC
  Administered 2016-03-30: 250 mg via INTRAMUSCULAR

## 2016-03-30 MED ORDER — CEFTRIAXONE SODIUM 250 MG IJ SOLR
INTRAMUSCULAR | Status: AC
  Filled 2016-03-30: qty 250

## 2016-03-30 MED ORDER — DOXYCYCLINE HYCLATE 100 MG PO CAPS: 100.0000 mg | ORAL_CAPSULE | Freq: Two times a day (BID) | ORAL | Status: DC

## 2016-03-30 MED ORDER — LIDOCAINE HCL (PF) 1 % IJ SOLN
INTRAMUSCULAR | Status: AC
  Filled 2016-03-30: qty 5

## 2016-03-30 NOTE — ED Provider Notes (Signed)
CSN: [id number]     Arrival date & time 03/30/16  1927 History   None    Chief Complaint  Patient presents with  . Hematuria   (Consider location/radiation/quality/duration/timing/severity/associated sxs/prior Treatment)  HPI   The patient is a 40 year old male presents today with complaints of gross hematuria 2-3 days. Patient states he had bright red bleeding in the urine this morning which has since resolved to a pale yellow urine with no evidence of visible blood. Chin the patient states he has severe testicular pain which radiates from his scrotum towards his anus. Patient was evaluated by Dr. Janice Norrie last year for same. Is unable to relate findings other than he was on medications for approximately one year.  Past Medical History  Diagnosis Date  . Raynaud's syndrome   . Hypertension   . GERD (gastroesophageal reflux disease)   . History of MI (myocardial infarction)     pt states at age 65 had Stress-induced heart attack/  also states had myoview Jan 2016 w/ PCP and it was normal  . History of chest pain   . History of chronic bronchitis   . Prediabetes   . Lower urinary tract symptoms (LUTS)   . Gross hematuria   . History of kidney stones   . History of primary genital syphilis     1997-- treated w/ penicillin injection's  . Prostate CA ([place])     hx of prostate CA per pt   Past Surgical History  Procedure Laterality Date  . No past surgeries    . Cystoscopy N/A 02/17/2015    Procedure: CYSTOSCOPY;  Surgeon: Lowella Bandy, MD;  Location: [facility];  Service: Urology;  Laterality: N/A;   No family history on file. Social History  Substance Use Topics  . Smoking status: Former Smoker -- 0.25 packs/day for 11 years    Types: Cigarettes    Quit date: 02/16/2000  . Smokeless tobacco: Never Used  . Alcohol Use: Yes     Comment: 1 beer/week    Review of Systems  Constitutional: Negative.  Negative for fever.  HENT: Negative.   Eyes: Negative.    Respiratory: Negative.   Cardiovascular: Negative.   Gastrointestinal: Negative.  Negative for nausea, vomiting, abdominal pain and abdominal distention.  Endocrine: Negative.   Genitourinary: Positive for hematuria and testicular pain. Negative for urgency, frequency, decreased urine volume, discharge, penile swelling, scrotal swelling, genital sores and penile pain.  Musculoskeletal: Negative.   Skin: Negative.  Negative for rash.  Allergic/Immunologic: Negative.   Neurological: Negative.   Hematological: Negative.   Psychiatric/Behavioral: Negative.     Allergies  Review of patient's allergies indicates no known allergies.  Home Medications   Prior to Admission medications   Medication Sig Start Date End Date Taking? Authorizing Provider  amoxicillin (AMOXIL) 500 MG capsule Take 1 capsule by mouth 4 (four) times daily. 07/05/15   Historical Provider, MD  cyclobenzaprine (FLEXERIL) 5 MG tablet Take 1 tablet (5 mg total) by mouth 3 (three) times daily as needed for muscle spasms. Patient not taking: Reported on 08/24/2015 07/10/15   Ripudeep Krystal Eaton, MD  doxycycline (VIBRAMYCIN) 100 MG capsule Take 1 capsule (100 mg total) by mouth 2 (two) times daily. 03/30/16   Nehemiah Settle, NP  felodipine (PLENDIL) 2.5 MG 24 hr tablet Take 2.5 mg by mouth daily as needed.     Historical Provider, MD  omeprazole (PRILOSEC) 40 MG capsule Take 40 mg by mouth daily as needed.  Historical Provider, MD  senna-docusate (SENOKOT-S) 8.6-50 MG tablet Take 1 tablet by mouth at bedtime as needed for moderate constipation. Patient not taking: Reported on 08/24/2015 07/10/15   Ripudeep Krystal Eaton, MD  traMADol (ULTRAM) 50 MG tablet Take 1 tablet (50 mg total) by mouth every 6 (six) hours as needed for moderate pain or severe pain. Patient not taking: Reported on 08/24/2015 07/10/15   Ripudeep Krystal Eaton, MD   Meds Ordered and Administered this Visit   Medications  cefTRIAXone (ROCEPHIN) injection 250 mg (250 mg  Intramuscular Given 03/30/16 2033)    BP 117/83 mmHg  Pulse 65  Temp(Src) 98.3 F (36.8 C) (Oral)  Resp 16  SpO2 100% No data found.  Physical Exam  Constitutional: He appears well-developed and well-nourished. No distress.  Cardiovascular: Normal rate, regular rhythm, normal heart sounds and intact distal pulses.  Exam reveals no gallop and no friction rub.   No murmur heard. Pulmonary/Chest: Effort normal and breath sounds normal. No respiratory distress. He has no wheezes. He has no rales. He exhibits no tenderness.  Genitourinary: Penis normal. Cremasteric reflex is present. Right testis shows no mass, no swelling and no tenderness. Right testis is descended. Cremasteric reflex is not absent on the right side. Left testis shows tenderness. Left testis shows no swelling. Left testis is descended. Cremasteric reflex is not absent on the left side. No penile tenderness.  Lymphadenopathy:       Right: No inguinal adenopathy present.       Left: No inguinal adenopathy present.  Skin: Skin is warm and dry. He is not diaphoretic.  Nursing note and vitals reviewed.   Urine patient brought with him this morning shows gross hematuria on visible examination, but in a gatorade bottle and not suitable for testing.   ED Course  Procedures (including critical care time)  Labs Review Labs Reviewed  POCT URINALYSIS DIP (DEVICE) - Abnormal; Notable for the following:    Hgb urine dipstick TRACE (*)    All other components within normal limits  URINE CULTURE  URINE CYTOLOGY ANCILLARY ONLY    Results for orders placed or performed during the hospital encounter of 03/30/16  POCT urinalysis dip (device)  Result Value Ref Range   Glucose, UA NEGATIVE NEGATIVE mg/dL   Bilirubin Urine NEGATIVE NEGATIVE   Ketones, ur NEGATIVE NEGATIVE mg/dL   Specific Gravity, Urine 1.020 1.005 - 1.030   Hgb urine dipstick TRACE (A) NEGATIVE   pH 6.0 5.0 - 8.0   Protein, ur NEGATIVE NEGATIVE mg/dL    Urobilinogen, UA 1.0 0.0 - 1.0 mg/dL   Nitrite NEGATIVE NEGATIVE   Leukocytes, UA NEGATIVE NEGATIVE    Pending cultures and STD tests.  Last PSA last year was normal.  Considered BPH, but exam findings consistent with epididymitis.  Imaging Review No results found.   MDM   1. Epididymitis   2. Hematuria    Meds ordered this encounter  Medications  . cefTRIAXone (ROCEPHIN) injection 250 mg    Sig:   . doxycycline (VIBRAMYCIN) 100 MG capsule    Sig: Take 1 capsule (100 mg total) by mouth 2 (two) times daily.    Dispense:  20 capsule    Refill:  0     The patient is to follow-up with [facility] on Monday.  Trace urine present at this time.  Given Rocephin IM and prescription for doxycycline. The patient verbalizes understanding and agrees to plan of care.       Nehemiah Settle, NP  03/30/16 2047 

## 2016-03-30 NOTE — Discharge Instructions (Signed)
Call Monday for appointment [facility] for follow up.   Epididymitis Epididymitis is swelling (inflammation) of the epididymis. The epididymis is a cord-like structure that is located along the top and back part of the testicle. It collects and stores sperm from the testicle. This condition can also cause pain and swelling of the testicle and scrotum. Symptoms usually start suddenly (acute epididymitis). Sometimes epididymitis starts gradually and lasts for a while (chronic epididymitis). This type may be harder to treat. CAUSES In men 37 and younger, this condition is usually caused by a bacterial infection or sexually transmitted disease (STD), such as:  Gonorrhea.  Chlamydia.  In men 82 and older who do not have anal sex, this condition is usually caused by bacteria from a blockage or abnormalities in the urinary system. These can result from:  Having a tube placed into the bladder (urinary catheter).  Having an enlarged or inflamed prostate gland.  Having recent urinary tract surgery. In men who have a condition that weakens the body's defense system (immune system), such as HIV, this condition can be caused by:   Other bacteria, including tuberculosis and syphilis.  Viruses.  Fungi. Sometimes this condition occurs without infection. That may happen if urine flows backward into the epididymis after heavy lifting or straining. RISK FACTORS This condition is more likely to develop in men:  Who have unprotected sex with more than one partner.  Who have anal sex.   Who have recently had surgery.   Who have a urinary catheter.  Who have urinary problems.  Who have a suppressed immune system. SYMPTOMS  This condition usually begins suddenly with chills, fever, and pain behind the scrotum and in the testicle. Other symptoms include:   Swelling of the scrotum, testicle, or both.  Pain whenejaculatingor urinating.  Pain in the back or  belly.  Nausea.  Itching and discharge from the penis.  Frequent need to pass urine.  Redness and tenderness of the scrotum. DIAGNOSIS Your health care provider can diagnose this condition based on your symptoms and medical history. Your health care provider will also do a physical exam to ask about your symptoms and check your scrotum and testicle for swelling, pain, and redness. You may also have other tests, including:   Examination of discharge from the penis.  Urine tests for infections, such as STDs.  Your health care provider may test you for other STDs, including HIV. TREATMENT Treatment for this condition depends on the cause. If your condition is caused by a bacterial infection, oral antibiotic medicine may be prescribed. If the bacterial infection has spread to your blood, you may need to receive IV antibiotics. Nonbacterial epididymitis is treated with home care that includes bed rest and elevation of the scrotum. Surgery may be needed to treat:  Bacterial epididymitis that causes pus to build up in the scrotum (abscess).  Chronic epididymitis that has not responded to other treatments. HOME CARE INSTRUCTIONS Medicines  Take over-the-counter and prescription medicines only as told by your health care provider.   If you were prescribed an antibiotic medicine, take it as told by your health care provider. Do not stop taking the antibiotic even if your condition improves. Sexual Activity  If your epididymitis was caused by an STD, avoid sexual activity until your treatment is complete.  Inform your sexual partner or partners if you test positive for an STD. They may need to be treated.Do not engage in sexual activity with your partner or partners until their treatment is  completed. General Instructions  Return to your normal activities as told by your health care provider. Ask your health care provider what activities are safe for you.  Keep your scrotum  elevated and supported while resting. Ask your health care provider if you should wear a scrotal support, such as a jockstrap. Wear it as told by your health care provider.  If directed, apply ice to the affected area:   Put ice in a plastic bag.  Place a towel between your skin and the bag.  Leave the ice on for 20 minutes, 2-3 times per day.  Try taking a sitz bath to help with discomfort. This is a warm water bath that is taken while you are sitting down. The water should only come up to your hips and should cover your buttocks. Do this 3-4 times per day or as told by your health care provider.  Keep all follow-up visits as told by your health care provider. This is important. SEEK MEDICAL CARE IF:   You have a fever.   Your pain medicine is not helping.   Your pain is getting worse.   Your symptoms do not improve within three days.   This information is not intended to replace advice given to you by your health care provider. Make sure you discuss any questions you have with your health care provider.   Document Released: 09/20/2000 Document Revised: 06/14/2015 Document Reviewed: 02/08/2015 Elsevier Interactive Patient Education [employer].

## 2016-03-30 NOTE — ED Notes (Signed)
Patient provided a clear, gold urine specimen.

## 2016-03-30 NOTE — ED Notes (Signed)
Patient reports history of the same.  Patient has seen dr Janice Norrie, but dr Janice Norrie has retired.  Patient is scheduled for appt with dr Janice Norrie office in august.  Patient is seeing his pcp this Wednesday.  Patient has had symptoms for a month, waxing and waning in severity

## 2016-04-01 LAB — URINE CYTOLOGY ANCILLARY ONLY
Chlamydia: NEGATIVE
Neisseria Gonorrhea: NEGATIVE

## 2016-04-01 LAB — URINE CULTURE: CULTURE: NO GROWTH

## 2017-05-20 ENCOUNTER — Ambulatory Visit (INDEPENDENT_AMBULATORY_CARE_PROVIDER_SITE_OTHER): Payer: 59

## 2017-05-20 ENCOUNTER — Encounter (HOSPITAL_COMMUNITY): Payer: Self-pay | Admitting: Emergency Medicine

## 2017-05-20 ENCOUNTER — Ambulatory Visit (HOSPITAL_COMMUNITY)
Admission: EM | Admit: 2017-05-20 | Discharge: 2017-05-20 | Disposition: A | Discharge status: Home / Self Care | Payer: 59 | Attending: Physician Assistant | Admitting: Physician Assistant

## 2017-05-20 DIAGNOSIS — S93402A Sprain of unspecified ligament of left ankle, initial encounter: Secondary | ICD-10-CM

## 2017-05-20 MED ORDER — NAPROXEN 500 MG PO TABS: 500.0000 mg | ORAL_TABLET | Freq: Two times a day (BID) | ORAL | 0 refills | Status: AC

## 2017-05-20 NOTE — Discharge Instructions (Signed)
Start naproxen as directed. Ice/heat compresses and elevation. Ankle brace during activity. This can take up to 2-3 weeks to completely resolve, but he should be feeling better each week. If experiencing worsening of symptoms, numbness, tingling follow-up with PCP for further evaluation and treatment needed.

## 2017-05-20 NOTE — ED Triage Notes (Signed)
PT rolled left ankle 2 weeks ago and pain has gotten worse. PT also injured right great toe the same night.

## 2017-05-20 NOTE — ED Provider Notes (Signed)
[place]    CSN: [id number] Arrival date & time: 05/20/17  1947     History   Chief Complaint Chief Complaint  Patient presents with  . Foot Injury    HPI George Gonzales is a 41 y.o. male.   41 year old male comes in for 2 week history of twisting ankle and falling. States he was running while this was happening. Has increasing pain, and now hard to bear weight. Left foot pain most significant on the medial aspect, states with associated swelling, that has since resolved after nonweightbearing for the past couple of days. Denies numbness, tingling. States need to confirm what he can and cannot do with work, would like x-ray done.      Past Medical History:  Diagnosis Date  . GERD (gastroesophageal reflux disease)   . Gross hematuria   . History of chest pain   . History of chronic bronchitis   . History of kidney stones   . History of MI (myocardial infarction)    pt states at age 63 had Stress-induced heart attack/  also states had myoview Jan 2016 w/ PCP and it was normal  . History of primary genital syphilis    1997-- treated w/ penicillin injection's  . Hypertension   . Lower urinary tract symptoms (LUTS)   . Prediabetes   . Prostate CA ([place])    hx of prostate CA per pt  . Raynaud's syndrome     Patient Active Problem List   Diagnosis Date Noted  . 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 Right sided weakness 07/09/2015  . HTN (hypertension) 07/09/2015  . GERD (gastroesophageal reflux disease) 07/09/2015  . WEIGHT LOSS 03/28/2009  . GERD 09/22/2008  . POLYURIA 09/22/2008  . VIRAL INFECTION 07/01/2007  . PHARYNGITIS 07/01/2007  . HEMORRHOIDS, EXTERNAL 06/12/2007  . BACK PAIN, CHRONIC 06/11/2007  . SNORING 06/11/2007  . GONORRHEA 03/13/2007  . 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 FATIGUE 09/12/2006  . CHEST PAIN, NON-CARDIAC 09/18/2004  . SYPHILIS 10/08/1995    Past Surgical History:  Procedure Laterality Date  .  CYSTOSCOPY N/A 02/17/2015   Procedure: CYSTOSCOPY;  Surgeon: Lowella Bandy, MD;  Location: [facility];  Service: Urology;  Laterality: N/A;  . NO PAST SURGERIES         Home Medications    Prior to Admission medications   Medication Sig Start Date End Date Taking? Authorizing Provider  felodipine (PLENDIL) 2.5 MG 24 hr tablet Take 2.5 mg by mouth daily as needed.     [provider]  naproxen (NAPROSYN) 500 MG tablet Take 1 tablet (500 mg total) by mouth 2 (two) times daily. 05/20/17 05/30/17  Ok Edwards, PA-C  omeprazole (PRILOSEC) 40 MG capsule Take 40 mg by mouth daily as needed.     [provider]  senna-docusate (SENOKOT-S) 8.6-50 MG tablet Take 1 tablet by mouth at bedtime as needed for moderate constipation. Patient not taking: Reported on 08/24/2015 07/10/15   Rai, Vernelle Emerald, MD  traMADol (ULTRAM) 50 MG tablet Take 1 tablet (50 mg total) by mouth every 6 (six) hours as needed for moderate pain or severe pain. Patient not taking: Reported on 08/24/2015 07/10/15   Mendel Corning, MD    Family History No family history on file.  Social History Social History  Substance Use Topics  . Smoking status: Former Smoker    Packs/day: 0.25    Years: 11.00    Types: Cigarettes    Quit date: 02/16/2000  .  Smokeless tobacco: Never Used  . Alcohol use Yes     Comment: 1 beer/week     Allergies   Patient has no known allergies.   Review of Systems Review of Systems  Reason unable to perform ROS: See HPI as above.     Physical Exam Triage Vital Signs ED Triage Vitals  Enc Vitals Group     BP 05/20/17 2032 124/81     Pulse Rate 05/20/17 2032 87     Resp 05/20/17 2032 16     Temp 05/20/17 2032 98.4 F (36.9 C)     Temp Source 05/20/17 2032 Oral     SpO2 05/20/17 2032 100 %     Weight 05/20/17 2032 158 lb (71.7 kg)     Height --      Head Circumference --      Peak Flow --      Pain Score 05/20/17 2033 4     Pain Loc --      Pain Edu? --        Excl. in [place]? --    No data found.   Updated Vital Signs BP 124/81   Pulse 87   Temp 98.4 F (36.9 C) (Oral)   Resp 16   Wt 158 lb (71.7 kg)   SpO2 100%   BMI 22.67 kg/m      Physical Exam  Constitutional: He is oriented to person, place, and time. He appears well-developed and well-nourished. No distress.  HENT:  Head: Normocephalic and atraumatic.  Musculoskeletal:  No swelling noted. Tenderness on palpation of the medial aspect of left foot. Tenderness on palpation of the medial malleolus. Full range of motion although with pain. Strength normal and equal bilaterally, though with pain. Sensation intact and equal bilaterally. Pedal pulses 2+ and equal bilaterally.  Neurological: He is alert and oriented to person, place, and time.     UC Treatments / Results  Labs (all labs ordered are listed, but only abnormal results are displayed) Labs Reviewed - No data to display  EKG  EKG Interpretation None       Radiology Dg Ankle Complete Left  Result Date: 05/20/2017 CLINICAL DATA:  [place] and injured left ankle.  No time course given. EXAM: LEFT ANKLE COMPLETE - 3+ VIEW COMPARISON:  None. FINDINGS: The ankle mortise is maintained. No ankle fracture or osteochondral abnormality. No definite ankle joint effusion. The mid and hindfoot bony structures are intact. IMPRESSION: No acute bony findings. Electronically Signed   By: Marijo Sanes M.D.   On: 05/20/2017 20:59    Procedures Procedures (including critical care time)  Medications Ordered in UC Medications - No data to display   Initial Impression / Assessment and Plan / UC Course  I have reviewed the triage vital signs and the nursing notes.  Pertinent labs & imaging results that were available during my care of the patient were reviewed by me and considered in my medical decision making (see chart for details).    Discussed with patient given decrease swelling, normal strength, low suspicion for fractures.  Patient will like x-ray to provide for work. X-ray negative for dislocation or fracture. Start NSAIDs for pain and inflammation. Ankle brace during activity. Discussed with patient this can take up to 2-3 weeks to completely resolve, but should be feeling better each week. Return precautions given.    Final Clinical Impressions(s) / UC Diagnoses   Final diagnoses:  Sprain of left ankle, unspecified ligament, initial encounter    New  Prescriptions New Prescriptions   NAPROXEN (NAPROSYN) 500 MG TABLET    Take 1 tablet (500 mg total) by mouth 2 (two) times daily.       Ok Edwards, PA-C 05/20/17 2107    Ok Edwards, PA-C 05/20/17 2107

## 2017-08-03 ENCOUNTER — Encounter (HOSPITAL_COMMUNITY): Payer: Self-pay | Admitting: Emergency Medicine

## 2017-08-03 DIAGNOSIS — Z8546 Personal history of malignant neoplasm of prostate: Secondary | ICD-10-CM | POA: Diagnosis not present

## 2017-08-03 DIAGNOSIS — Z87891 Personal history of nicotine dependence: Secondary | ICD-10-CM | POA: Diagnosis not present

## 2017-08-03 DIAGNOSIS — K029 Dental caries, unspecified: Secondary | ICD-10-CM | POA: Diagnosis not present

## 2017-08-03 DIAGNOSIS — I252 Old myocardial infarction: Secondary | ICD-10-CM | POA: Diagnosis not present

## 2017-08-03 DIAGNOSIS — I10 Essential (primary) hypertension: Secondary | ICD-10-CM | POA: Insufficient documentation

## 2017-08-03 DIAGNOSIS — K0889 Other specified disorders of teeth and supporting structures: Secondary | ICD-10-CM | POA: Diagnosis present

## 2017-08-03 NOTE — ED Triage Notes (Signed)
Pt c/o 8/10 right upper dental pain since last night no relief from [facility] medication, pt states he thinks that he has an abscess.

## 2017-08-04 ENCOUNTER — Emergency Department (HOSPITAL_COMMUNITY)
Admission: EM | Admit: 2017-08-04 | Discharge: 2017-08-04 | Disposition: A | Discharge status: Home / Self Care | Payer: 59 | Attending: Emergency Medicine | Admitting: Emergency Medicine

## 2017-08-04 DIAGNOSIS — K029 Dental caries, unspecified: Secondary | ICD-10-CM

## 2017-08-04 DIAGNOSIS — K0889 Other specified disorders of teeth and supporting structures: Secondary | ICD-10-CM

## 2017-08-04 MED ORDER — PENICILLIN V POTASSIUM 250 MG PO TABS
500.0000 mg | ORAL_TABLET | Freq: Once | ORAL | Status: AC
  Administered 2017-08-04: 500 mg via ORAL
  Filled 2017-08-04: qty 2

## 2017-08-04 MED ORDER — PENICILLIN V POTASSIUM 500 MG PO TABS
500.0000 mg | ORAL_TABLET | Freq: Three times a day (TID) | ORAL | 0 refills | Status: DC
Start: 2017-08-04 — End: 2017-11-28

## 2017-08-04 MED ORDER — IBUPROFEN 800 MG PO TABS
800.0000 mg | ORAL_TABLET | Freq: Three times a day (TID) | ORAL | 0 refills | Status: DC
Start: 2017-08-04 — End: 2018-12-08

## 2017-08-04 MED ORDER — HYDROCODONE-ACETAMINOPHEN 5-325 MG PO TABS
2.0000 | ORAL_TABLET | Freq: Once | ORAL | Status: AC
  Administered 2017-08-04: 2 via ORAL
  Filled 2017-08-04: qty 2

## 2017-08-04 NOTE — Discharge Instructions (Addendum)
1. Medications: ibuprofen, penicillin, usual home medications 2. Treatment: rest, drink plenty of fluids, take medications as prescribed 3. Follow Up: Please followup with dentistry within 1 week for discussion of your diagnoses and further evaluation after today's visit; if you do not have a primary care doctor use the resource guide provided to find one; Return to the ER for high fevers, difficulty breathing, difficulty swallowing or other concerning symptoms

## 2017-08-04 NOTE — ED Provider Notes (Signed)
[place] EMERGENCY DEPARTMENT Provider Note   CSN: [id number] Arrival date & time: 08/03/17  2329     History   Chief Complaint Chief Complaint  Patient presents with  . Dental Pain    HPI George Gonzales is a 41 y.o. male with a hx of GERD, kidney stones, chronic bronchitis presents to the Emergency Department complaining of gradual, persistent, progressively worsening right upper dental pain onset 3-4 days ago. Associated symptoms include pain when chewing or opening his mouth but patient denies trismus.  Patient has been taking Tylenol but nothing seems to make his symptoms better.  Eating seems to make them worse.  Patient denies fevers or chills, nausea or vomiting, difficulty swallowing, changes in voice.  Patient reports it has been more than 1 year since he last saw a dentist.  The history is provided by the patient and medical records. No language interpreter was used.    Past Medical History:  Diagnosis Date  . GERD (gastroesophageal reflux disease)   . Gross hematuria   . History of chest pain   . History of chronic bronchitis   . History of kidney stones   . History of MI (myocardial infarction)    pt states at age 11 had Stress-induced heart attack/  also states had myoview Jan 2016 w/ PCP and it was normal  . History of primary genital syphilis    1997-- treated w/ penicillin injection's  . Hypertension   . Lower urinary tract symptoms (LUTS)   . Prediabetes   . Prostate CA ([place])    hx of prostate CA per pt  . Raynaud's syndrome     Patient Active Problem List   Diagnosis Date Noted  . 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 Right sided weakness 07/09/2015  . HTN (hypertension) 07/09/2015  . GERD (gastroesophageal reflux disease) 07/09/2015  . WEIGHT LOSS 03/28/2009  . GERD 09/22/2008  . POLYURIA 09/22/2008  . VIRAL INFECTION 07/01/2007  . PHARYNGITIS 07/01/2007  . HEMORRHOIDS, EXTERNAL 06/12/2007  . BACK PAIN, CHRONIC  06/11/2007  . SNORING 06/11/2007  . GONORRHEA 03/13/2007  . 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 FATIGUE 09/12/2006  . CHEST PAIN, NON-CARDIAC 09/18/2004  . SYPHILIS 10/08/1995    Past Surgical History:  Procedure Laterality Date  . CYSTOSCOPY N/A 02/17/2015   Procedure: CYSTOSCOPY;  Surgeon: Lowella Bandy, MD;  [facility]  Service: Urology;  Laterality: N/A;  . NO PAST SURGERIES         Home Medications    Prior to Admission medications   Medication Sig Start Date End Date Taking? Authorizing Provider  felodipine (PLENDIL) 2.5 MG 24 hr tablet Take 2.5 mg by mouth daily as needed.     [provider]  ibuprofen (ADVIL,MOTRIN) 800 MG tablet Take 1 tablet (800 mg total) by mouth 3 (three) times daily. 08/04/17   Mansel Strother, Jarrett Soho, PA-C  omeprazole (PRILOSEC) 40 MG capsule Take 40 mg by mouth daily as needed.     [provider]  penicillin v potassium (VEETID) 500 MG tablet Take 1 tablet (500 mg total) by mouth 3 (three) times daily. 08/04/17   Ronel Rodeheaver, Jarrett Soho, PA-C  senna-docusate (SENOKOT-S) 8.6-50 MG tablet Take 1 tablet by mouth at bedtime as needed for moderate constipation. Patient not taking: Reported on 08/24/2015 07/10/15   Rai, Vernelle Emerald, MD  traMADol (ULTRAM) 50 MG tablet Take 1 tablet (50 mg total) by mouth every 6 (six) hours as needed for moderate pain or severe  pain. Patient not taking: Reported on 08/24/2015 07/10/15   Mendel Corning, MD    Family History No family history on file.  Social History Social History  Substance Use Topics  . Smoking status: Former Smoker    Packs/day: 0.25    Years: 11.00    Types: Cigarettes    Quit date: 02/16/2000  . Smokeless tobacco: Never Used  . Alcohol use Yes     Comment: 1 beer/week     Allergies   Patient has no known allergies.   Review of Systems Review of Systems  Constitutional: Negative for appetite change, chills and fever.  HENT: Positive for  dental problem. Negative for drooling, ear pain, facial swelling, nosebleeds, postnasal drip, rhinorrhea and trouble swallowing.   Eyes: Negative for pain and redness.  Respiratory: Negative for cough and wheezing.   Cardiovascular: Negative for chest pain.  Gastrointestinal: Negative for abdominal pain, nausea and vomiting.  Musculoskeletal: Negative for neck pain and neck stiffness.  Skin: Negative for color change and rash.  Neurological: Positive for headaches ( Generalized, throbbing). Negative for weakness and light-headedness.  All other systems reviewed and are negative.    Physical Exam Updated Vital Signs BP 108/75 (BP Location: Left Arm)   Pulse 77   Temp 97.9 F (36.6 C) (Oral)   Resp 14   Ht 5\' 10"  (1.778 m)   Wt 73 kg (161 lb)   SpO2 97%   BMI 23.10 kg/m   Physical Exam  Constitutional: He appears well-developed and well-nourished.  HENT:  Head: Normocephalic.  Right Ear: Tympanic membrane, external ear and ear canal normal.  Left Ear: Tympanic membrane, external ear and ear canal normal.  Nose: Nose normal. Right sinus exhibits no maxillary sinus tenderness and no frontal sinus tenderness. Left sinus exhibits no maxillary sinus tenderness and no frontal sinus tenderness.  Mouth/Throat: Uvula is midline, oropharynx is clear and moist and mucous membranes are normal. No oral lesions. Abnormal dentition. Dental caries present. No uvula swelling or lacerations. No oropharyngeal exudate, posterior oropharyngeal edema, posterior oropharyngeal erythema or tonsillar abscesses.  Tooth # 2 with mild erythema of the gingiva and tenderness to palpation No gross abscess No fluctuance or induration to the buccal mucosa or floor of the mouth  Eyes: Pupils are equal, round, and reactive to light. Conjunctivae are normal. Right eye exhibits no discharge. Left eye exhibits no discharge.  Neck: Normal range of motion. Neck supple.  No stridor Handling secretions without  difficulty No nuchal rigidity No cervical lymphadenopathy  Cardiovascular: Normal rate, regular rhythm and normal heart sounds.   Pulmonary/Chest: Effort normal. No respiratory distress.  Equal chest rise  Abdominal: Soft. Bowel sounds are normal. He exhibits no distension. There is no tenderness.  Lymphadenopathy:    He has no cervical adenopathy.  Neurological: He is alert.  Skin: Skin is warm and dry.  Psychiatric: He has a normal mood and affect.  Nursing note and vitals reviewed.    ED Treatments / Results   Procedures Procedures (including critical care time)  Medications Ordered in ED Medications  HYDROcodone-acetaminophen (NORCO/VICODIN) 5-325 MG per tablet 2 tablet (not administered)  penicillin v potassium (VEETID) tablet 500 mg (not administered)     Initial Impression / Assessment and Plan / ED Course  I have reviewed the triage vital signs and the nursing notes.  Pertinent labs & imaging results that were available during my care of the patient were reviewed by me and considered in my medical decision making (see chart  for details).     Patient with toothache.  No gross abscess.  Exam unconcerning for Ludwig's angina or spread of infection.  Will treat with penicillin and anti-inflammatories medicine.  Urged patient to follow-up with dentist.     Final Clinical Impressions(s) / ED Diagnoses   Final diagnoses:  Pain, dental  Pain due to dental caries    New Prescriptions New Prescriptions   IBUPROFEN (ADVIL,MOTRIN) 800 MG TABLET    Take 1 tablet (800 mg total) by mouth 3 (three) times daily.   PENICILLIN V POTASSIUM (VEETID) 500 MG TABLET    Take 1 tablet (500 mg total) by mouth 3 (three) times daily.     Aaryana Betke, Gwenlyn Perking 08/04/17 Kallie Locks, MD 08/04/17 [phone]

## 2017-11-27 ENCOUNTER — Encounter (HOSPITAL_BASED_OUTPATIENT_CLINIC_OR_DEPARTMENT_OTHER): Payer: Self-pay

## 2017-11-27 ENCOUNTER — Emergency Department (HOSPITAL_BASED_OUTPATIENT_CLINIC_OR_DEPARTMENT_OTHER): Payer: 59

## 2017-11-27 ENCOUNTER — Other Ambulatory Visit: Payer: Self-pay

## 2017-11-27 DIAGNOSIS — I252 Old myocardial infarction: Secondary | ICD-10-CM | POA: Insufficient documentation

## 2017-11-27 DIAGNOSIS — J069 Acute upper respiratory infection, unspecified: Secondary | ICD-10-CM | POA: Insufficient documentation

## 2017-11-27 DIAGNOSIS — Z79899 Other long term (current) drug therapy: Secondary | ICD-10-CM | POA: Diagnosis not present

## 2017-11-27 DIAGNOSIS — Z8546 Personal history of malignant neoplasm of prostate: Secondary | ICD-10-CM | POA: Insufficient documentation

## 2017-11-27 DIAGNOSIS — I10 Essential (primary) hypertension: Secondary | ICD-10-CM | POA: Insufficient documentation

## 2017-11-27 DIAGNOSIS — Z87891 Personal history of nicotine dependence: Secondary | ICD-10-CM | POA: Diagnosis not present

## 2017-11-27 DIAGNOSIS — R05 Cough: Secondary | ICD-10-CM | POA: Diagnosis present

## 2017-11-27 DIAGNOSIS — B349 Viral infection, unspecified: Secondary | ICD-10-CM | POA: Insufficient documentation

## 2017-11-27 NOTE — ED Triage Notes (Signed)
Pt c/o productive cough x 2wks; taken OTC with no relief, told to come here by pharmacy

## 2017-11-28 ENCOUNTER — Encounter (HOSPITAL_COMMUNITY): Payer: Self-pay | Admitting: Family Medicine

## 2017-11-28 ENCOUNTER — Encounter (HOSPITAL_BASED_OUTPATIENT_CLINIC_OR_DEPARTMENT_OTHER): Payer: Self-pay | Admitting: Emergency Medicine

## 2017-11-28 ENCOUNTER — Ambulatory Visit (HOSPITAL_COMMUNITY)
Admission: EM | Admit: 2017-11-28 | Discharge: 2017-11-28 | Disposition: A | Discharge status: Home / Self Care | Payer: 59 | Source: Home / Self Care | Attending: Family Medicine | Admitting: Family Medicine

## 2017-11-28 ENCOUNTER — Emergency Department (HOSPITAL_BASED_OUTPATIENT_CLINIC_OR_DEPARTMENT_OTHER)
Admission: EM | Admit: 2017-11-28 | Discharge: 2017-11-28 | Disposition: A | Discharge status: Home / Self Care | Payer: 59 | Attending: Emergency Medicine | Admitting: Emergency Medicine

## 2017-11-28 DIAGNOSIS — Z23 Encounter for immunization: Secondary | ICD-10-CM | POA: Diagnosis not present

## 2017-11-28 DIAGNOSIS — S61412A Laceration without foreign body of left hand, initial encounter: Secondary | ICD-10-CM

## 2017-11-28 DIAGNOSIS — W312XXA Contact with powered woodworking and forming machines, initial encounter: Secondary | ICD-10-CM

## 2017-11-28 DIAGNOSIS — J069 Acute upper respiratory infection, unspecified: Secondary | ICD-10-CM

## 2017-11-28 DIAGNOSIS — B9789 Other viral agents as the cause of diseases classified elsewhere: Secondary | ICD-10-CM

## 2017-11-28 MED ORDER — TETANUS-DIPHTH-ACELL PERTUSSIS 5-2.5-18.5 LF-MCG/0.5 IM SUSP
0.5000 mL | Freq: Once | INTRAMUSCULAR | Status: AC
  Administered 2017-11-28: 0.5 mL via INTRAMUSCULAR

## 2017-11-28 MED ORDER — TETANUS-DIPHTH-ACELL PERTUSSIS 5-2.5-18.5 LF-MCG/0.5 IM SUSP
INTRAMUSCULAR | Status: AC
  Filled 2017-11-28: qty 0.5

## 2017-11-28 MED ORDER — DEXAMETHASONE SODIUM PHOSPHATE 10 MG/ML IJ SOLN
10.0000 mg | Freq: Once | INTRAMUSCULAR | Status: AC
  Administered 2017-11-28: 10 mg via INTRAMUSCULAR
  Filled 2017-11-28: qty 1

## 2017-11-28 MED ORDER — BENZONATATE 100 MG PO CAPS
200.0000 mg | ORAL_CAPSULE | Freq: Once | ORAL | Status: AC
  Administered 2017-11-28: 200 mg via ORAL
  Filled 2017-11-28: qty 2

## 2017-11-28 MED ORDER — FLUTICASONE PROPIONATE 50 MCG/ACT NA SUSP: 2.0000 | Freq: Every day | NASAL | 0 refills | Status: DC

## 2017-11-28 MED ORDER — LIDOCAINE HCL (PF) 2 % IJ SOLN
INTRAMUSCULAR | Status: AC
  Filled 2017-11-28: qty 2

## 2017-11-28 MED ORDER — BENZONATATE 100 MG PO CAPS: 100.0000 mg | ORAL_CAPSULE | Freq: Three times a day (TID) | ORAL | 0 refills | Status: DC

## 2017-11-28 NOTE — ED Triage Notes (Signed)
Pt here for laceration to left thumb. sts that he cut while using a circular saw. Bleeding controlled. Able to move finger.

## 2017-11-28 NOTE — ED Provider Notes (Signed)
[place] EMERGENCY DEPARTMENT Provider Note   CSN: [id number] Arrival date & time: 11/27/17  2304     History   Chief Complaint Chief Complaint  Patient presents with  . Cough    HPI George Gonzales is a 42 y.o. male.  The history is provided by the patient.  Cough  This is a new problem. The current episode started more than 1 week ago. The problem occurs every few minutes. The problem has not changed since onset.The cough is non-productive. Associated symptoms include rhinorrhea and myalgias. Pertinent negatives include no chest pain, no chills, no sweats, no weight loss, no ear congestion, no ear pain, no headaches, no sore throat, no shortness of breath, no wheezing and no eye redness. Associated symptoms comments: No leg pain or swelling no long car trips or plane trips. Treatments tried: theraflu. The treatment provided no relief. Risk factors: none. His past medical history does not include COPD or asthma.    Past Medical History:  Diagnosis Date  . GERD (gastroesophageal reflux disease)   . Gross hematuria   . History of chest pain   . History of chronic bronchitis   . History of kidney stones   . History of MI (myocardial infarction)    pt states at age 56 had Stress-induced heart attack/  also states had myoview Jan 2016 w/ PCP and it was normal  . History of primary genital syphilis    1997-- treated w/ penicillin injection's  . Hypertension   . Lower urinary tract symptoms (LUTS)   . Prediabetes   . Prostate CA ([place])    hx of prostate CA per pt  . Raynaud's syndrome     Patient Active Problem List   Diagnosis Date Noted  . 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 Right sided weakness 07/09/2015  . HTN (hypertension) 07/09/2015  . GERD (gastroesophageal reflux disease) 07/09/2015  . WEIGHT LOSS 03/28/2009  . GERD 09/22/2008  . POLYURIA 09/22/2008  . VIRAL INFECTION 07/01/2007  . PHARYNGITIS 07/01/2007  . HEMORRHOIDS, EXTERNAL  06/12/2007  . BACK PAIN, CHRONIC 06/11/2007  . SNORING 06/11/2007  . GONORRHEA 03/13/2007  . 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 FATIGUE 09/12/2006  . CHEST PAIN, NON-CARDIAC 09/18/2004  . SYPHILIS 10/08/1995    Past Surgical History:  Procedure Laterality Date  . CYSTOSCOPY N/A 02/17/2015   Procedure: CYSTOSCOPY;  Surgeon: Lowella Bandy, MD;  Location: [facility];  Service: Urology;  Laterality: N/A;  . NO PAST SURGERIES         Home Medications    Prior to Admission medications   Medication Sig Start Date End Date Taking? Authorizing Provider  benzonatate (TESSALON) 100 MG capsule Take 1 capsule (100 mg total) by mouth every 8 (eight) hours. 11/28/17   Chanele Douglas, MD  felodipine (PLENDIL) 2.5 MG 24 hr tablet Take 2.5 mg by mouth daily as needed.     [provider]  fluticasone (FLONASE) 50 MCG/ACT nasal spray Place 2 sprays into both nostrils daily. 11/28/17   Bebe Moncure, MD  ibuprofen (ADVIL,MOTRIN) 800 MG tablet Take 1 tablet (800 mg total) by mouth 3 (three) times daily. 08/04/17   Muthersbaugh, Jarrett Soho, PA-C  omeprazole (PRILOSEC) 40 MG capsule Take 40 mg by mouth daily as needed.     [provider]  penicillin v potassium (VEETID) 500 MG tablet Take 1 tablet (500 mg total) by mouth 3 (three) times daily. 08/04/17   Muthersbaugh, Jarrett Soho, PA-C  senna-docusate (SENOKOT-S) 8.6-50 MG tablet  Take 1 tablet by mouth at bedtime as needed for moderate constipation. Patient not taking: Reported on 08/24/2015 07/10/15   Rai, Vernelle Emerald, MD  traMADol (ULTRAM) 50 MG tablet Take 1 tablet (50 mg total) by mouth every 6 (six) hours as needed for moderate pain or severe pain. Patient not taking: Reported on 08/24/2015 07/10/15   Mendel Corning, MD    Family History No family history on file.  Social History Social History   Tobacco Use  . Smoking status: Former Smoker    Packs/day: 0.25    Years: 11.00    Pack years: 2.75    Types:  Cigarettes    Last attempt to quit: 02/16/2000    Years since quitting: 17.7  . Smokeless tobacco: Never Used  Substance Use Topics  . Alcohol use: Yes    Comment: 1 beer/week  . Drug use: No     Allergies   Patient has no known allergies.   Review of Systems Review of Systems  Constitutional: Negative for chills, fever and weight loss.  HENT: Positive for congestion and rhinorrhea. Negative for drooling, ear pain and sore throat.   Eyes: Negative for redness.  Respiratory: Positive for cough. Negative for shortness of breath, wheezing and stridor.   Cardiovascular: Negative for chest pain, palpitations and leg swelling.  Musculoskeletal: Positive for myalgias.  Neurological: Negative for headaches.  All other systems reviewed and are negative.    Physical Exam Updated Vital Signs BP (!) 137/102 (BP Location: Left Arm)   Pulse 87   Temp 98.6 F (37 C)   Resp 18   Ht 5\' 10"  (1.778 m)   Wt 73.9 kg (163 lb)   SpO2 100%   BMI 23.39 kg/m   Physical Exam  Constitutional: He is oriented to person, place, and time. He appears well-developed and well-nourished. No distress.  HENT:  Head: Normocephalic and atraumatic.  Mouth/Throat: No oropharyngeal exudate.  Eyes: Conjunctivae are normal. Pupils are equal, round, and reactive to light.  Neck: Normal range of motion. Neck supple.  Cardiovascular: Normal rate, regular rhythm, normal heart sounds and intact distal pulses.  Pulmonary/Chest: Effort normal and breath sounds normal. No stridor. He has no wheezes. He has no rales.  Abdominal: Soft. Bowel sounds are normal. He exhibits no mass. There is no tenderness. There is no rebound and no guarding. No hernia.  Musculoskeletal: Normal range of motion.  Neurological: He is alert and oriented to person, place, and time.  Skin: Skin is warm and dry. Capillary refill takes less than 2 seconds.  Psychiatric: He has a normal mood and affect.     ED Treatments / Results    Radiology Dg Chest 2 View  Result Date: 11/27/2017 CLINICAL DATA:  Cough and congestion with flu like symptoms EXAM: CHEST  2 VIEW COMPARISON:  10/27/2014 FINDINGS: The heart size and mediastinal contours are within normal limits. Both lungs are clear. The visualized skeletal structures are unremarkable. IMPRESSION: No active cardiopulmonary disease. Electronically Signed   By: Ashley Royalty M.D.   On: 11/27/2017 23:53    Procedures Procedures (including critical care time)  Medications Ordered in ED Medications  benzonatate (TESSALON) capsule 200 mg (not administered)  dexamethasone (DECADRON) injection 10 mg (not administered)      Final Clinical Impressions(s) / ED Diagnoses  Return for weakness, numbness, changes in vision or speech,  fevers > 100.4 unrelieved by medication, shortness of breath, intractable vomiting, or diarrhea, abdominal pain, Inability to tolerate liquids or food, cough,  altered mental status or any concerns. No signs of systemic illness or infection. The patient is nontoxic-appearing on exam and vital signs are within normal limits.    I have reviewed the triage vital signs and the nursing notes. Pertinent labs &imaging results that were available during my care of the patient were reviewed by me and considered in my medical decision making (see chart for details).  After history, exam, and medical workup I feel the patient has been appropriately medically screened and is safe for discharge home. Pertinent diagnoses were discussed with the patient. Patient was given return precautions.    Final diagnoses:  Viral URI with cough    ED Discharge Orders        Ordered    benzonatate (TESSALON) 100 MG capsule  Every 8 hours     11/28/17 0231    fluticasone (FLONASE) 50 MCG/ACT nasal spray  Daily     11/28/17 0231       Wilmon Conover, MD 11/28/17 7680

## 2017-11-28 NOTE — ED Provider Notes (Signed)
[place]    CSN: [id number] Arrival date & time: 11/28/17  1916     History   Chief Complaint Chief Complaint  Patient presents with  . Laceration    HPI George Gonzales is a 42 y.o. male.   HPI  The patient is working on his kitchen and cut his left thumb on a circular saw.  It started to bleed immediately.  He is able to move his finger okay.  Sensation is intact.  He is having some tingling.  He is not up-to-date with his tetanus shot.  Past Medical History:  Diagnosis Date  . GERD (gastroesophageal reflux disease)   . Gross hematuria   . History of chest pain   . History of chronic bronchitis   . History of kidney stones   . History of MI (myocardial infarction)    pt states at age 17 had Stress-induced heart attack/  also states had myoview Jan 2016 w/ PCP and it was normal  . History of primary genital syphilis    1997-- treated w/ penicillin injection's  . Hypertension   . Lower urinary tract symptoms (LUTS)   . Prediabetes   . Prostate CA ([place])    hx of prostate CA per pt  . Raynaud's syndrome     Patient Active Problem List   Diagnosis Date Noted  . 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 Right sided weakness 07/09/2015  . HTN (hypertension) 07/09/2015  . GERD (gastroesophageal reflux disease) 07/09/2015  . WEIGHT LOSS 03/28/2009  . GERD 09/22/2008  . POLYURIA 09/22/2008  . VIRAL INFECTION 07/01/2007  . PHARYNGITIS 07/01/2007  . HEMORRHOIDS, EXTERNAL 06/12/2007  . BACK PAIN, CHRONIC 06/11/2007  . SNORING 06/11/2007  . GONORRHEA 03/13/2007  . 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 FATIGUE 09/12/2006  . CHEST PAIN, NON-CARDIAC 09/18/2004  . SYPHILIS 10/08/1995    Past Surgical History:  Procedure Laterality Date  . CYSTOSCOPY N/A 02/17/2015   Procedure: CYSTOSCOPY;  Surgeon: Lowella Bandy, MD;  Location: [facility];  Service: Urology;  Laterality: N/A;  . NO PAST SURGERIES         Home Medications      Prior to Admission medications   Medication Sig Start Date End Date Taking? Authorizing Provider  felodipine (PLENDIL) 2.5 MG 24 hr tablet Take 2.5 mg by mouth daily as needed.     [provider]  fluticasone (FLONASE) 50 MCG/ACT nasal spray Place 2 sprays into both nostrils daily. 11/28/17   Palumbo, April, MD  ibuprofen (ADVIL,MOTRIN) 800 MG tablet Take 1 tablet (800 mg total) by mouth 3 (three) times daily. 08/04/17   Muthersbaugh, Jarrett Soho, PA-C  omeprazole (PRILOSEC) 40 MG capsule Take 40 mg by mouth daily as needed.     [provider]    Family History History reviewed. No pertinent family history.  Social History Social History   Tobacco Use  . Smoking status: Former Smoker    Packs/day: 0.25    Years: 11.00    Pack years: 2.75    Types: Cigarettes    Last attempt to quit: 02/16/2000    Years since quitting: 17.7  . Smokeless tobacco: Never Used  Substance Use Topics  . Alcohol use: Yes    Comment: 1 beer/week  . Drug use: No     Allergies   Patient has no known allergies.   Review of Systems Review of Systems  Skin: Positive for wound.     Physical Exam Triage Vital Signs ED  Triage Vitals  Enc Vitals Group     BP 11/28/17 1928 120/83     Pulse Rate 11/28/17 1928 (!) 109     Resp 11/28/17 1928 18     Temp 11/28/17 1928 98.4 F (36.9 C)     Temp src --      SpO2 11/28/17 1928 100 %   Updated Vital Signs BP 120/83   Pulse (!) 109   Temp 98.4 F (36.9 C)   Resp 18   SpO2 100%   Physical Exam  Constitutional: He appears well-developed and well-nourished.  Cardiovascular: Normal rate and regular rhythm.  Pulmonary/Chest: Effort normal and breath sounds normal. No respiratory distress.  Musculoskeletal:  Normal range of motion of thumb  Neurological: He is alert. No sensory deficit. Coordination normal.  Skin:  There is a jagged and poorly approximated laceration of the palmar surface of the left thumb over the distal phalanx.   I do not appreciate any bony or tendinous involvement.  It is approximately 3 cm in length.  Psychiatric: He has a normal mood and affect. Judgment normal.     UC Treatments / Results  Labs (all labs ordered are listed, but only abnormal results are displayed) Labs Reviewed - No data to display  EKG  EKG Interpretation None       Radiology Dg Chest 2 View  Result Date: 11/27/2017 CLINICAL DATA:  Cough and congestion with flu like symptoms EXAM: CHEST  2 VIEW COMPARISON:  10/27/2014 FINDINGS: The heart size and mediastinal contours are within normal limits. Both lungs are clear. The visualized skeletal structures are unremarkable. IMPRESSION: No active cardiopulmonary disease. Electronically Signed   By: Ashley Royalty M.D.   On: 11/27/2017 23:53    Procedures Laceration Repair Date/Time: 11/28/2017 9:16 PM Performed by: Shelda Pal, DO Authorized by: Shelda Pal, DO   Consent:    Consent obtained:  Verbal   Consent given by:  Patient   Risks discussed:  Infection, pain and poor cosmetic result Anesthesia (see MAR for exact dosages):    Anesthesia method:  Local infiltration   Local anesthetic:  Lidocaine 2% w/o epi Laceration details:    Location:  Finger   Finger location:  L thumb   Length (cm):  3 Repair type:    Repair type:  Simple Pre-procedure details:    Preparation:  Patient was prepped and draped in usual sterile fashion Treatment:    Area cleansed with:  Betadine   Amount of cleaning:  Standard Skin repair:    Repair method:  Sutures   Suture size:  4-0   Suture material:  Nylon   Suture technique:  Simple interrupted   Number of sutures:  5 Approximation:    Approximation:  Loose Post-procedure details:    Dressing:  Tube gauze   Patient tolerance of procedure:  Tolerated well, no immediate complications   Medications Ordered in UC Medications  Tdap (BOOSTRIX) injection 0.5 mL (0.5 mLs Intramuscular Given 11/28/17 1958)      Initial Impression / Assessment and Plan / UC Course  I have reviewed the triage vital signs and the nursing notes.  Pertinent labs & imaging results that were available during my care of the patient were reviewed by me and considered in my medical decision making (see chart for details).     42 year old male presents with laceration to his left thumb after injury with saw.  He is neurovascularly intact.  Tetanus updated.  5 simple sutures were placed with  surprisingly good approximation.  He was warned beforehand that given the loss of tissue, he will likely not have the best cosmetic result.  Follow-up with PCP in 10 days for suture removal.  Aftercare instructions given.  The patient voiced understanding and agreement to the plan.  Final Clinical Impressions(s) / UC Diagnoses   Final diagnoses:  Laceration of left hand without foreign body, initial encounter    Controlled Substance Prescriptions [place] Controlled Substance Registry consulted? Not Applicable   Shelda Pal, [place] 11/28/17 2120

## 2018-12-08 ENCOUNTER — Ambulatory Visit (INDEPENDENT_AMBULATORY_CARE_PROVIDER_SITE_OTHER): Payer: 59

## 2018-12-08 ENCOUNTER — Ambulatory Visit (HOSPITAL_COMMUNITY)
Admission: EM | Admit: 2018-12-08 | Discharge: 2018-12-08 | Disposition: A | Discharge status: Home / Self Care | Payer: 59 | Attending: Emergency Medicine | Admitting: Emergency Medicine

## 2018-12-08 ENCOUNTER — Other Ambulatory Visit: Payer: Self-pay

## 2018-12-08 ENCOUNTER — Encounter (HOSPITAL_COMMUNITY): Payer: Self-pay | Admitting: Emergency Medicine

## 2018-12-08 DIAGNOSIS — R059 Cough, unspecified: Secondary | ICD-10-CM

## 2018-12-08 DIAGNOSIS — R042 Hemoptysis: Secondary | ICD-10-CM

## 2018-12-08 DIAGNOSIS — I10 Essential (primary) hypertension: Secondary | ICD-10-CM | POA: Diagnosis not present

## 2018-12-08 DIAGNOSIS — Z79899 Other long term (current) drug therapy: Secondary | ICD-10-CM | POA: Insufficient documentation

## 2018-12-08 DIAGNOSIS — Z87891 Personal history of nicotine dependence: Secondary | ICD-10-CM | POA: Diagnosis not present

## 2018-12-08 DIAGNOSIS — R05 Cough: Secondary | ICD-10-CM

## 2018-12-08 DIAGNOSIS — J42 Unspecified chronic bronchitis: Secondary | ICD-10-CM | POA: Diagnosis not present

## 2018-12-08 NOTE — Discharge Instructions (Signed)
I am concerned that you could have a serious cause of your symptoms.  Go immediately to the [facility] ER for a comprehensive work-up.

## 2018-12-08 NOTE — ED Triage Notes (Addendum)
Pt states he has had a cough few weeks, dx with bronchitis and copd and started on antibiotics. States finished antibiotics but states yesterday started noting streaks of blood in sputum. States has forceful cough. States was at urgent care today and had a cxr done, pt unsure of results but pt was told to come to ED for further testing.

## 2018-12-08 NOTE — ED Provider Notes (Signed)
HPI  SUBJECTIVE:  George Gonzales is a 43 y.o. male who presents with 25-30 episodes of hemoptysis starting today.  He reports that his throat is burning, and reports 1 week of substernal chest pain described as soreness, tightness. He reports wheezing, fatigue, shortness of breath and dyspnea on exertion today.  He was diagnosed with bronchitis 3 or 4 weeks ago at another urgent care, and has also been seen by his PMD 1 week ago for this cough.  He was tested for COPD, which was inconclusive.  Had a EKG done because of the chest pain, which was negative.  He has been placed on Tessalon, prednisone taper with minimal improvement in his symptoms.  States that he is requiring his albuterol inhaler 5-6 times a day, normally uses it only as needed.  States symptoms are better with going to the cool air, worse with going to the warm air.  He denies fevers, unintentional weight loss, night sweats.  No calf pain, swelling.  No surgery in the past 4 weeks, prolonged immobilization, exogenous estrogen.  No nasal congestion, rhinorrhea, sinus pain or pressure.  He denies any GERD symptoms.  He is a past medical history of GERD, MI, pneumonia, COPD/chronic bronchitis, prostate cancer.  No history of PE, DVT, CHF, HIV, IVDU, antiplatelet anticoagulant use.  No history of alcohol abuse, esophageal varices, cirrhosis.  FTD:DUKGURK, Christean Grief, MD   Past Medical History:  Diagnosis Date  . GERD (gastroesophageal reflux disease)   . Gross hematuria   . History of chest pain   . History of chronic bronchitis   . History of kidney stones   . History of MI (myocardial infarction)    pt states at age 67 had Stress-induced heart attack/  also states had myoview Jan 2016 w/ PCP and it was normal  . History of primary genital syphilis    1997-- treated w/ penicillin injection's  . Hypertension   . Lower urinary tract symptoms (LUTS)   . Prediabetes   . Prostate CA ([place])    hx of prostate CA per pt  . Raynaud's syndrome      Past Surgical History:  Procedure Laterality Date  . CYSTOSCOPY N/A 02/17/2015   Procedure: CYSTOSCOPY;  Surgeon: Lowella Bandy, MD;  Location: [facility];  Service: Urology;  Laterality: N/A;  . NO PAST SURGERIES      History reviewed. No pertinent family history.  Social History   Tobacco Use  . Smoking status: Former Smoker    Packs/day: 0.25    Years: 11.00    Pack years: 2.75    Types: Cigarettes    Last attempt to quit: 02/16/2000    Years since quitting: 18.8  . Smokeless tobacco: Never Used  Substance Use Topics  . Alcohol use: Yes    Comment: 1 beer/week  . Drug use: No    No current facility-administered medications for this encounter.   Current Outpatient Medications:  .  felodipine (PLENDIL) 2.5 MG 24 hr tablet, Take 2.5 mg by mouth daily as needed. , Disp: , Rfl:  .  tamsulosin (FLOMAX) 0.4 MG CAPS capsule, Take 0.4 mg by mouth., Disp: , Rfl:   No Known Allergies   ROS  As noted in HPI.   Physical Exam  BP 132/74 (BP Location: Right Arm)   Pulse 79   Temp 98.2 F (36.8 C) (Temporal)   Resp 18   SpO2 100%   Constitutional: Well developed, well nourished, no acute distress Eyes:  EOMI, conjunctiva  normal bilaterally HENT: Normocephalic, atraumatic,mucus membranes moist..  Mild nasal congestion.  No sinus tenderness.  Normal oropharynx.  No obvious postnasal drip. Respiratory: Normal inspiratory effort lungs clear bilaterally, good air movement.  No chest wall tenderness Cardiovascular: Normal rate rhythm, no murmurs, rubs, gallops  GI: nondistended skin: No rash, skin intact Musculoskeletal: no deformities.  Calves symmetric, nontender, no edema. Neurologic: Alert & oriented x 3, no focal neuro deficits Psychiatric: Speech and behavior appropriate   ED Course   Medications - No data to display  Orders Placed This Encounter  Procedures  . DG Chest 2 View    Standing Status:   Standing    Number of Occurrences:   1     Order Specific Question:   Reason for Exam (SYMPTOM  OR DIAGNOSIS REQUIRED)    Answer:   cough x 1 month r/o mass, pna pulm edema,. effusion    No results found for this or any previous visit (from the past 24 hour(s)). Dg Chest 2 View  Result Date: 12/08/2018 CLINICAL DATA:  Cough for 1 month. Shortness of breath. EXAM: CHEST - 2 VIEW COMPARISON:  11/27/2017 FINDINGS: The cardiomediastinal contours are normal. The lungs are clear. No evidence of pulmonary mass. Pulmonary vasculature is normal. No consolidation, pleural effusion, or pneumothorax. No acute osseous abnormalities are seen. IMPRESSION: Unremarkable radiographs of the chest. Electronically Signed   By: Keith Rake M.D.   On: 12/08/2018 21:06    ED Clinical Impression  Hemoptysis  Cough   ED Assessment/Plan  Reviewed imaging independently.  Normal chest x-ray.  See radiology report for full details.  Given the amount of hemoptysis that the patient is reporting, concern for another, more serious cause of his symptoms.  While he has no objective findings and has a normal chest x-ray, transferring to the ED for comprehensive evaluation.  Feel the patient is stable go by private vehicle.  Discussed rationale for transfer to the emergency department.  Patient agrees to go to the [facility] ED immediately.   No orders of the defined types were placed in this encounter.   *This clinic note was created using Dragon dictation software. Therefore, there may be occasional mistakes despite careful proofreading.   ?   Melynda Ripple, MD 12/08/18 2122

## 2018-12-08 NOTE — ED Triage Notes (Signed)
The patient presented to the [facility] with a complaint of a cough x 1 month and he reported that he started coughing up blood yesterday.

## 2018-12-09 ENCOUNTER — Emergency Department: Payer: 59

## 2018-12-09 ENCOUNTER — Emergency Department
Admission: EM | Admit: 2018-12-09 | Discharge: 2018-12-09 | Disposition: A | Discharge status: Home / Self Care | Payer: 59 | Attending: Emergency Medicine | Admitting: Emergency Medicine

## 2018-12-09 DIAGNOSIS — R042 Hemoptysis: Secondary | ICD-10-CM

## 2018-12-09 DIAGNOSIS — J42 Unspecified chronic bronchitis: Secondary | ICD-10-CM

## 2018-12-09 LAB — COMPREHENSIVE METABOLIC PANEL
ALBUMIN: 4 g/dL (ref 3.5–5.0)
ALT: 25 U/L (ref 0–44)
AST: 25 U/L (ref 15–41)
Alkaline Phosphatase: 57 U/L (ref 38–126)
Anion gap: 8 (ref 5–15)
BILIRUBIN TOTAL: 0.5 mg/dL (ref 0.3–1.2)
BUN: 10 mg/dL (ref 6–20)
CO2: 25 mmol/L (ref 22–32)
Calcium: 8.5 mg/dL — ABNORMAL LOW (ref 8.9–10.3)
Chloride: 105 mmol/L (ref 98–111)
Creatinine, Ser: 0.97 mg/dL (ref 0.61–1.24)
GFR calc Af Amer: >60 mL/min (ref >60)
GFR calc non Af Amer: >60 mL/min (ref >60)
GLUCOSE: 100 mg/dL — AB (ref 70–99)
Potassium: 3.8 mmol/L (ref 3.5–5.1)
SODIUM: 138 mmol/L (ref 135–145)
TOTAL PROTEIN: 6.8 g/dL (ref 6.5–8.1)

## 2018-12-09 LAB — CBC
HCT: 43.2 % (ref 39.0–52.0)
HEMOGLOBIN: 14.5 g/dL (ref 13.0–17.0)
MCH: 30.1 pg (ref 26.0–34.0)
MCHC: 33.6 g/dL (ref 30.0–36.0)
MCV: 89.8 fL (ref 80.0–100.0)
PLATELETS: 241 10*3/uL (ref 150–400)
RBC: 4.81 MIL/uL (ref 4.22–5.81)
RDW: 12.7 % (ref 11.5–15.5)
WBC: 10.1 10*3/uL (ref 4.0–10.5)
nRBC: 0 % (ref 0.0–0.2)

## 2018-12-09 MED ORDER — BENZONATATE 100 MG PO CAPS
100.0000 mg | ORAL_CAPSULE | Freq: Once | ORAL | Status: AC
  Administered 2018-12-09: 100 mg via ORAL
  Filled 2018-12-09: qty 1

## 2018-12-09 MED ORDER — ALBUTEROL SULFATE (2.5 MG/3ML) 0.083% IN NEBU
INHALATION_SOLUTION | RESPIRATORY_TRACT | Status: AC
  Filled 2018-12-09: qty 3

## 2018-12-09 MED ORDER — IOPAMIDOL (ISOVUE-370) INJECTION 76%
100.0000 mL | Freq: Once | INTRAVENOUS | Status: AC | PRN
  Administered 2018-12-09: 100 mL via INTRAVENOUS

## 2018-12-09 MED ORDER — IPRATROPIUM-ALBUTEROL 0.5-2.5 (3) MG/3ML IN SOLN
3.0000 mL | Freq: Once | RESPIRATORY_TRACT | Status: AC
  Administered 2018-12-09: 3 mL via RESPIRATORY_TRACT
  Filled 2018-12-09: qty 3

## 2018-12-09 MED ORDER — PREDNISONE 20 MG PO TABS: 60.0000 mg | ORAL_TABLET | Freq: Every day | ORAL | 0 refills | Status: AC

## 2018-12-09 MED ORDER — AZITHROMYCIN 500 MG PO TABS: 500.0000 mg | ORAL_TABLET | Freq: Every day | ORAL | 0 refills | Status: AC

## 2018-12-09 NOTE — ED Provider Notes (Signed)
[facility] Emergency Department Provider Note   First MD Initiated Contact with Patient 12/09/18 [phone]     (approximate)  I have reviewed the triage vital signs and the nursing notes.   HISTORY  Chief Complaint Cough    HPI George Gonzales is a 43 y.o. male with below list of chronic medical conditions including GERD MI prostate cancer recently diagnosed COPD and bronchitis presents to the emergency department with history of hemoptysis with scant blood noted yesterday.  Patient was seen earlier today in urgent care and states that he had 25-30 episodes of hemoptysis.  Patient was diagnosed with bronchitis approximately 1 month ago for which he was prescribed antibiotics Tessalon and prednisone taper which he states that he has completed and that his cough had improved however starting yesterday cough return.        Past Medical History:  Diagnosis Date  . GERD (gastroesophageal reflux disease)   . Gross hematuria   . History of chest pain   . History of chronic bronchitis   . History of kidney stones   . History of MI (myocardial infarction)    pt states at age 40 had Stress-induced heart attack/  also states had myoview Jan 2016 w/ PCP and it was normal  . History of primary genital syphilis    1997-- treated w/ penicillin injection's  . Hypertension   . Lower urinary tract symptoms (LUTS)   . Prediabetes   . Prostate CA ([place])    hx of prostate CA per pt  . Raynaud's syndrome     Patient Active Problem List   Diagnosis Date Noted  . 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 Right sided weakness 07/09/2015  . HTN (hypertension) 07/09/2015  . GERD (gastroesophageal reflux disease) 07/09/2015  . WEIGHT LOSS 03/28/2009  . GERD 09/22/2008  . POLYURIA 09/22/2008  . VIRAL INFECTION 07/01/2007  . PHARYNGITIS 07/01/2007  . HEMORRHOIDS, EXTERNAL 06/12/2007  . BACK PAIN, CHRONIC 06/11/2007  . SNORING 06/11/2007  . GONORRHEA 03/13/2007  .  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 FATIGUE 09/12/2006  . CHEST PAIN, NON-CARDIAC 09/18/2004  . SYPHILIS 10/08/1995    Past Surgical History:  Procedure Laterality Date  . CYSTOSCOPY N/A 02/17/2015   Procedure: CYSTOSCOPY;  Surgeon: Lowella Bandy, MD;  Location: [facility];  Service: Urology;  Laterality: N/A;  . NO PAST SURGERIES      Prior to Admission medications   Medication Sig Start Date End Date Taking? Authorizing Provider  felodipine (PLENDIL) 2.5 MG 24 hr tablet Take 2.5 mg by mouth daily as needed.     [provider]  tamsulosin (FLOMAX) 0.4 MG CAPS capsule Take 0.4 mg by mouth.    [provider]    Allergies Patient has no known allergies.  No family history on file.  Social History Social History   Tobacco Use  . Smoking status: Former Smoker    Packs/day: 0.25    Years: 11.00    Pack years: 2.75    Types: Cigarettes    Last attempt to quit: 02/16/2000    Years since quitting: 18.8  . Smokeless tobacco: Never Used  Substance Use Topics  . Alcohol use: Yes    Comment: 1 beer/week  . Drug use: No    Review of Systems Constitutional: No fever/chills Eyes: No visual changes. ENT: No sore throat. Cardiovascular: Denies chest pain. Respiratory: Positive for cough and dyspnea Gastrointestinal: No abdominal pain.  No nausea, no vomiting.  No  diarrhea.  No constipation. Genitourinary: Negative for dysuria. Musculoskeletal: Negative for neck pain.  Negative for back pain. Integumentary: Negative for rash. Neurological: Negative for headaches, focal weakness or numbness.  ____________________________________________   PHYSICAL EXAM:  VITAL SIGNS: ED Triage Vitals [12/08/18 2309]  Enc Vitals Group     BP (!) 127/93     Pulse Rate 81     Resp 20     Temp 98.4 F (36.9 C)     Temp Source Oral     SpO2 100 %     Weight 72.6 kg (160 lb)     Height 1.778 m (5\' 10" )     Head Circumference      Peak Flow       Pain Score 5     Pain Loc      Pain Edu?      Excl. in [place]?     Constitutional: Alert and oriented. Well appearing and in no acute distress. Eyes: Conjunctivae are normal. PERRL. EOMI. Mouth/Throat: Mucous membranes are moist.  Oropharynx non-erythematous. Neck: No stridor.   Cardiovascular: Normal rate, regular rhythm. Good peripheral circulation. Grossly normal heart sounds. Respiratory: Normal respiratory effort.  No retractions. Lungs CTAB. Gastrointestinal: Soft and nontender. No distention.  Musculoskeletal: No lower extremity tenderness nor edema. No gross deformities of extremities. Neurologic:  Normal speech and language. No gross focal neurologic deficits are appreciated.  Skin:  Skin is warm, dry and intact. No rash noted. Psychiatric: Mood and affect are normal. Speech and behavior are normal.  ____________________________________________   LABS (all labs ordered are listed, but only abnormal results are displayed)  Labs Reviewed  CBC  COMPREHENSIVE METABOLIC PANEL     RADIOLOGY I, [place], personally viewed and evaluated these images (plain radiographs) as part of my medical decision making, as well as reviewing the written report by the radiologist.  ED MD interpretation: Unremarkable chest x-ray.  CT scan of the chest revealed negative CTA of the chest per radiologist.  Official radiology report(s): Dg Chest 2 View  Result Date: 12/08/2018 CLINICAL DATA:  Cough for 1 month. Shortness of breath. EXAM: CHEST - 2 VIEW COMPARISON:  11/27/2017 FINDINGS: The cardiomediastinal contours are normal. The lungs are clear. No evidence of pulmonary mass. Pulmonary vasculature is normal. No consolidation, pleural effusion, or pneumothorax. No acute osseous abnormalities are seen. IMPRESSION: Unremarkable radiographs of the chest. Electronically Signed   By: Keith Rake M.D.   On: 12/08/2018 21:06   Ct Angio Chest Pe W And/or Wo Contrast  Result Date:  12/09/2018 CLINICAL DATA:  Mild-to-moderate hemoptysis. EXAM: CT ANGIOGRAPHY CHEST WITH CONTRAST TECHNIQUE: Multidetector CT imaging of the chest was performed using the standard protocol during bolus administration of intravenous contrast. Multiplanar CT image reconstructions and MIPs were obtained to evaluate the vascular anatomy. CONTRAST:  130mL ISOVUE-370 IOPAMIDOL (ISOVUE-370) INJECTION 76% COMPARISON:  None. FINDINGS: Cardiovascular: Satisfactory opacification of the pulmonary arteries to the segmental level. No evidence of pulmonary embolism. Normal heart size. No pericardial effusion. Mediastinum/Nodes: Negative for mass or adenopathy Lungs/Pleura: Mild biapical pleural based scarring. There is no edema, infarct, consolidation, effusion, or pneumothorax. Upper Abdomen: Negative Musculoskeletal: Negative Review of the MIP images confirms the above findings. IMPRESSION: Negative chest CTA Electronically Signed   By: Monte Fantasia M.D.   On: 12/09/2018 05:26     Procedures   ____________________________________________   INITIAL IMPRESSION / MDM / ASSESSMENT AND PLAN / ED COURSE  As part of my medical decision making, I reviewed the  following data within the [place] NUMBER  43 year old male presenting with above-stated history and physical exam consistent with chronic bronchitis however given multiple episodes of coughing with hemoptysis patient referred to the emergency department from urgent care.  Laboratory data unremarkable likewise CT scan of the chest revealed no evidence of pulmonary emboli or any other abnormality per the radiologist.  As such I suspect the patient's hemoptysis to be secondary to chronic bronchitis.  Patient will be prescribed azithromycin and prednisone for home with recommendation to follow-up with primary care provider. ____________________________________________  FINAL CLINICAL IMPRESSION(S) / ED DIAGNOSES  Final diagnoses:  Chronic bronchitis,  unspecified chronic bronchitis type ([place])  Cough with hemoptysis     MEDICATIONS GIVEN DURING THIS VISIT:  Medications  iopamidol (ISOVUE-370) 76 % injection 100 mL (100 mLs Intravenous Contrast Given 12/09/18 0508)  ipratropium-albuterol (DUONEB) 0.5-2.5 (3) MG/3ML nebulizer solution 3 mL (3 mLs Nebulization Given 12/09/18 0549)  benzonatate (TESSALON) capsule 100 mg (100 mg Oral Given 12/09/18 0549)     ED Discharge Orders    None       Note:  This document was prepared using Dragon voice recognition software and may include unintentional dictation errors.   Gregor Hams, MD 12/09/18 0630

## 2019-04-02 ENCOUNTER — Other Ambulatory Visit: Payer: Self-pay | Admitting: Orthopaedic Surgery

## 2019-04-02 DIAGNOSIS — M545 Low back pain, unspecified: Secondary | ICD-10-CM

## 2019-04-13 ENCOUNTER — Encounter (HOSPITAL_COMMUNITY): Payer: Self-pay

## 2019-04-13 ENCOUNTER — Ambulatory Visit (HOSPITAL_COMMUNITY)
Admission: EM | Admit: 2019-04-13 | Discharge: 2019-04-13 | Disposition: A | Discharge status: Home / Self Care | Payer: 59 | Attending: Emergency Medicine | Admitting: Emergency Medicine

## 2019-04-13 ENCOUNTER — Other Ambulatory Visit: Payer: Self-pay

## 2019-04-13 DIAGNOSIS — W57XXXA Bitten or stung by nonvenomous insect and other nonvenomous arthropods, initial encounter: Secondary | ICD-10-CM

## 2019-04-13 DIAGNOSIS — L03317 Cellulitis of buttock: Secondary | ICD-10-CM

## 2019-04-13 DIAGNOSIS — S30860A Insect bite (nonvenomous) of lower back and pelvis, initial encounter: Secondary | ICD-10-CM | POA: Diagnosis not present

## 2019-04-13 MED ORDER — DOXYCYCLINE HYCLATE 100 MG PO CAPS: 100.0000 mg | ORAL_CAPSULE | Freq: Two times a day (BID) | ORAL | 0 refills | Status: AC

## 2019-04-13 NOTE — ED Provider Notes (Signed)
[place]    CSN: [id number] Arrival date & time: 04/13/19  1236      History   Chief Complaint Chief Complaint  Patient presents with  . Insect Bite    HPI George Gonzales is a 43 y.o. male.   Patient presents today with "spider bite" on his left buttock x3 days.  He has been taking Benadryl and using hydrocortisone cream without improvement.  He states there is increased redness, swelling, and pain today.  He denies fever, chills, vomiting, diarrhea, rash, other bites.  The history is provided by the patient.    Past Medical History:  Diagnosis Date  . GERD (gastroesophageal reflux disease)   . Gross hematuria   . History of chest pain   . History of chronic bronchitis   . History of kidney stones   . History of MI (myocardial infarction)    pt states at age 24 had Stress-induced heart attack/  also states had myoview Jan 2016 w/ PCP and it was normal  . History of primary genital syphilis    1997-- treated w/ penicillin injection's  . Hypertension   . Lower urinary tract symptoms (LUTS)   . Prediabetes   . Prostate CA ([place])    hx of prostate CA per pt  . Raynaud's syndrome     Patient Active Problem List   Diagnosis Date Noted  . 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 Right sided weakness 07/09/2015  . HTN (hypertension) 07/09/2015  . GERD (gastroesophageal reflux disease) 07/09/2015  . WEIGHT LOSS 03/28/2009  . GERD 09/22/2008  . POLYURIA 09/22/2008  . VIRAL INFECTION 07/01/2007  . PHARYNGITIS 07/01/2007  . HEMORRHOIDS, EXTERNAL 06/12/2007  . BACK PAIN, CHRONIC 06/11/2007  . SNORING 06/11/2007  . GONORRHEA 03/13/2007  . 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 FATIGUE 09/12/2006  . CHEST PAIN, NON-CARDIAC 09/18/2004  . SYPHILIS 10/08/1995    Past Surgical History:  Procedure Laterality Date  . CYSTOSCOPY N/A 02/17/2015   Procedure: CYSTOSCOPY;  Surgeon: Lowella Bandy, MD;  Location: [facility];  Service: Urology;   Laterality: N/A;  . NO PAST SURGERIES         Home Medications    Prior to Admission medications   Medication Sig Start Date End Date Taking? Authorizing Provider  felodipine (PLENDIL) 2.5 MG 24 hr tablet Take 2.5 mg by mouth daily as needed.     [provider]  tamsulosin (FLOMAX) 0.4 MG CAPS capsule Take 0.4 mg by mouth.    [provider]    Family History History reviewed. No pertinent family history.  Social History Social History   Tobacco Use  . Smoking status: Former Smoker    Packs/day: 0.25    Years: 11.00    Pack years: 2.75    Types: Cigarettes    Quit date: 02/16/2000    Years since quitting: 19.1  . Smokeless tobacco: Never Used  Substance Use Topics  . Alcohol use: Yes    Comment: 1 beer/week  . Drug use: No     Allergies   Patient has no known allergies.   Review of Systems Review of Systems  Constitutional: Negative for chills and fever.  HENT: Negative for ear pain and sore throat.   Eyes: Negative for pain and visual disturbance.  Respiratory: Negative for cough and shortness of breath.   Cardiovascular: Negative for chest pain and palpitations.  Gastrointestinal: Negative for abdominal pain and vomiting.  Genitourinary: Negative for dysuria and hematuria.  Musculoskeletal:  Negative for arthralgias and back pain.  Skin: Positive for wound. Negative for color change and rash.  Neurological: Negative for seizures and syncope.  All other systems reviewed and are negative.    Physical Exam Triage Vital Signs ED Triage Vitals  Enc Vitals Group     BP 04/13/19 1325 119/79     Pulse Rate 04/13/19 1325 84     Resp 04/13/19 1325 18     Temp 04/13/19 1325 98.7 F (37.1 C)     Temp src --      SpO2 04/13/19 1325 100 %     Weight 04/13/19 1322 160 lb (72.6 kg)     Height --      Head Circumference --      Peak Flow --      Pain Score 04/13/19 1321 4     Pain Loc --      Pain Edu? --      Excl. in [place]? --    No data  found.  Updated Vital Signs BP 119/79 (BP Location: Right Arm)   Pulse 84   Temp 98.7 F (37.1 C)   Resp 18   Wt 160 lb (72.6 kg)   SpO2 100%   BMI 22.96 kg/m   Visual Acuity Right Eye Distance:   Left Eye Distance:   Bilateral Distance:    Right Eye Near:   Left Eye Near:    Bilateral Near:     Physical Exam Vitals signs and nursing note reviewed.  Constitutional:      Appearance: He is well-developed.  HENT:     Head: Normocephalic and atraumatic.  Eyes:     Conjunctiva/sclera: Conjunctivae normal.  Neck:     Musculoskeletal: Neck supple.  Cardiovascular:     Rate and Rhythm: Normal rate and regular rhythm.  Pulmonary:     Effort: Pulmonary effort is normal. No respiratory distress.     Breath sounds: Normal breath sounds.  Abdominal:     Palpations: Abdomen is soft.     Tenderness: There is no abdominal tenderness.  Skin:    General: Skin is warm and dry.     Findings: No rash.     Comments: Left buttock: 4 cm area of induration with two central 69mm raw wound; localized erythema. No streaks. No drainage.   Neurological:     Mental Status: He is alert.     Motor: No weakness.     Coordination: Coordination normal.     Gait: Gait normal.      UC Treatments / Results  Labs (all labs ordered are listed, but only abnormal results are displayed) Labs Reviewed - No data to display  EKG   Radiology No results found.  Procedures Procedures (including critical care time)  Medications Ordered in UC Medications - No data to display  Initial Impression / Assessment and Plan / UC Course  I have reviewed the triage vital signs and the nursing notes.  Pertinent labs & imaging results that were available during my care of the patient were reviewed by me and considered in my medical decision making (see chart for details).   Insect bite with cellulitis.  Treating today with doxycycline x10 days.  Discussed with patient wound care and application of  over-the-counter antibiotic cream twice a day.  Discussed that he should return here or follow-up with his primary care provider if he develops fever, chills, worsening pain, increased erythema, streaks, or other concerning symptoms.  Final Clinical Impressions(s) / UC  Diagnoses   Final diagnoses:  None   Discharge Instructions   None    ED Prescriptions    None     Controlled Substance Prescriptions [place] Controlled Substance Registry consulted? Not Applicable   Sharion Balloon, NP 04/13/19 [phone]

## 2019-04-13 NOTE — ED Triage Notes (Signed)
Pt states he was bitten by a spider on his left buttock. This happened 3 days ago.

## 2019-04-13 NOTE — Discharge Instructions (Addendum)
Take the antibiotic doxycycline twice a day for 10 days.  Apply antibiotic ointment such as Neosporin to the wound twice a day.  Return here or to your primary care provider if you develop fever, chills, worsening pain, worsening redness, streaks of red coming from the wound, or other concerning symptoms.

## 2019-04-17 ENCOUNTER — Telehealth: Payer: 59 | Admitting: Nurse Practitioner

## 2019-04-17 DIAGNOSIS — T63301A Toxic effect of unspecified spider venom, accidental (unintentional), initial encounter: Secondary | ICD-10-CM

## 2019-04-17 NOTE — Progress Notes (Signed)
Based on what you shared with me it looks like you have  A cellulitis from a spider bite,that should be evaluated in a face to face office visit. If it is not improving with doxycycline that is being taken then you need a face to face visit. You can try benadryl over the counter for the itching.   NOTE: If you entered your credit card information for this eVisit, you will not be charged. You may see a "hold" on your card for the $30 but that hold will drop off and you will not have a charge processed.  If you are having a true medical emergency please call 911.  If you need an urgent face to face visit, [place] has four urgent care centers for your convenience.  If you need care fast and have a high deductible or no insurance consider:   [email or web address] to reserve your spot online an avoid wait times  [facility] [address] [place], [place] [address] 8 am to 8 pm Monday-Friday 10 am to 4 pm Saturday-Sunday *Across the street from [employer]  [place], [address] 8 am to 5 pm Monday-Friday * In the [facility] on the [facility]   The following sites will take your  insurance:  . [facility] [place] a Provider at this Location  [address] [place], [place] [address] . 10 am to 8 pm Monday-Friday . 12 pm to 8 pm Saturday-Sunday   . [facility] at [place] a Provider at this Location  [address], [place], [place] [address] . 8 am to 8 pm Monday-Friday . 9 am to 6 pm Saturday . 11 am to 6 pm Sunday   . [facility] at [place] Get Driving Directions  [address] [place], [place] [address] . 8 am to 8 pm Monday-Friday . 8 am to 4 pm Saturday-Sunday   Your e-visit answers were reviewed by a  board certified advanced clinical practitioner to complete your personal care plan.

## 2019-05-03 ENCOUNTER — Other Ambulatory Visit: Payer: Self-pay | Admitting: Internal Medicine

## 2019-05-03 DIAGNOSIS — G459 Transient cerebral ischemic attack, unspecified: Secondary | ICD-10-CM

## 2019-05-04 ENCOUNTER — Ambulatory Visit
Admission: RE | Admit: 2019-05-04 | Discharge: 2019-05-04 | Disposition: A | Discharge status: Home / Self Care | Payer: 59 | Source: Ambulatory Visit | Attending: Orthopaedic Surgery | Admitting: Orthopaedic Surgery

## 2019-05-04 ENCOUNTER — Other Ambulatory Visit: Payer: Self-pay

## 2019-05-04 DIAGNOSIS — M545 Low back pain, unspecified: Secondary | ICD-10-CM

## 2019-05-26 ENCOUNTER — Other Ambulatory Visit: Payer: 59

## 2019-10-07 ENCOUNTER — Emergency Department (HOSPITAL_COMMUNITY): Payer: 59

## 2019-10-07 ENCOUNTER — Emergency Department (HOSPITAL_COMMUNITY)
Admission: EM | Admit: 2019-10-07 | Discharge: 2019-10-07 | Disposition: A | Discharge status: Home / Self Care | Payer: 59 | Attending: Emergency Medicine | Admitting: Emergency Medicine

## 2019-10-07 ENCOUNTER — Encounter (HOSPITAL_COMMUNITY): Payer: Self-pay | Admitting: *Deleted

## 2019-10-07 ENCOUNTER — Other Ambulatory Visit: Payer: Self-pay

## 2019-10-07 DIAGNOSIS — I10 Essential (primary) hypertension: Secondary | ICD-10-CM | POA: Diagnosis not present

## 2019-10-07 DIAGNOSIS — Z87891 Personal history of nicotine dependence: Secondary | ICD-10-CM | POA: Insufficient documentation

## 2019-10-07 DIAGNOSIS — G43109 Migraine with aura, not intractable, without status migrainosus: Secondary | ICD-10-CM | POA: Diagnosis not present

## 2019-10-07 DIAGNOSIS — R29818 Other symptoms and signs involving the nervous system: Secondary | ICD-10-CM | POA: Diagnosis present

## 2019-10-07 DIAGNOSIS — Z79899 Other long term (current) drug therapy: Secondary | ICD-10-CM | POA: Diagnosis not present

## 2019-10-07 LAB — RAPID URINE DRUG SCREEN, HOSP PERFORMED
Amphetamines: NOT DETECTED
Barbiturates: NOT DETECTED
Benzodiazepines: NOT DETECTED
Cocaine: NOT DETECTED
Opiates: NOT DETECTED
Tetrahydrocannabinol: NOT DETECTED

## 2019-10-07 LAB — CBC
HCT: 43.1 % (ref 39.0–52.0)
Hemoglobin: 15.2 g/dL (ref 13.0–17.0)
MCH: 30.9 pg (ref 26.0–34.0)
MCHC: 35.3 g/dL (ref 30.0–36.0)
MCV: 87.6 fL (ref 80.0–100.0)
Platelets: 231 10*3/uL (ref 150–400)
RBC: 4.92 MIL/uL (ref 4.22–5.81)
RDW: 12.5 % (ref 11.5–15.5)
WBC: 7.5 10*3/uL (ref 4.0–10.5)
nRBC: 0 % (ref 0.0–0.2)

## 2019-10-07 LAB — BASIC METABOLIC PANEL
Anion gap: 11 (ref 5–15)
BUN: 9 mg/dL (ref 6–20)
CO2: 23 mmol/L (ref 22–32)
Calcium: 9.1 mg/dL (ref 8.9–10.3)
Chloride: 102 mmol/L (ref 98–111)
Creatinine, Ser: 0.95 mg/dL (ref 0.61–1.24)
GFR calc Af Amer: >60 mL/min (ref >60)
GFR calc non Af Amer: >60 mL/min (ref >60)
Glucose, Bld: 116 mg/dL — ABNORMAL HIGH (ref 70–99)
Potassium: 3.5 mmol/L (ref 3.5–5.1)
Sodium: 136 mmol/L (ref 135–145)

## 2019-10-07 LAB — URINALYSIS, ROUTINE W REFLEX MICROSCOPIC
Bilirubin Urine: NEGATIVE
Glucose, UA: NEGATIVE mg/dL
Hgb urine dipstick: NEGATIVE
Ketones, ur: NEGATIVE mg/dL
Leukocytes,Ua: NEGATIVE
Nitrite: NEGATIVE
Protein, ur: NEGATIVE mg/dL
Specific Gravity, Urine: 1.013 (ref 1.005–1.030)
pH: 7 (ref 5.0–8.0)

## 2019-10-07 LAB — ETHANOL: Alcohol, Ethyl (B): <10 mg/dL (ref <10)

## 2019-10-07 LAB — CBG MONITORING, ED: Glucose-Capillary: 107 mg/dL — ABNORMAL HIGH (ref 70–99)

## 2019-10-07 MED ORDER — DIPHENHYDRAMINE HCL 50 MG/ML IJ SOLN
25.0000 mg | Freq: Once | INTRAMUSCULAR | Status: AC
  Administered 2019-10-07: 25 mg via INTRAVENOUS
  Filled 2019-10-07: qty 1

## 2019-10-07 MED ORDER — PROCHLORPERAZINE EDISYLATE 10 MG/2ML IJ SOLN
10.0000 mg | Freq: Once | INTRAMUSCULAR | Status: AC
  Administered 2019-10-07: 10 mg via INTRAVENOUS
  Filled 2019-10-07: qty 2

## 2019-10-07 MED ORDER — DEXAMETHASONE SODIUM PHOSPHATE 10 MG/ML IJ SOLN
10.0000 mg | Freq: Once | INTRAMUSCULAR | Status: AC
  Administered 2019-10-07: 10 mg via INTRAVENOUS
  Filled 2019-10-07: qty 1

## 2019-10-07 MED ORDER — SODIUM CHLORIDE 0.9% FLUSH
3.0000 mL | Freq: Once | INTRAVENOUS | Status: AC
  Administered 2019-10-07: 3 mL via INTRAVENOUS

## 2019-10-07 NOTE — ED Notes (Signed)
Pts mom wants a call back, says she has information that may be needed for pt. [place]

## 2019-10-07 NOTE — ED Triage Notes (Signed)
The pt is c/o his inability to move his legs and arms as he came in by ambuulance  He arrived by gems the pt called ems  They found him lying on the floor of his house not answering questions

## 2019-10-07 NOTE — ED Notes (Signed)
Pt ambulated, noticeable improvement in gait steadiness since earlier ambulation. Pt required minimal assistance.

## 2019-10-07 NOTE — ED Triage Notes (Signed)
C/o neck pain.

## 2019-10-07 NOTE — ED Provider Notes (Addendum)
[facility] EMERGENCY DEPARTMENT Provider Note  CSN: [id number] Arrival date & time: 10/07/19 [id number]  Chief Complaint(s) cannot move arms and legs  HPI George Gonzales is a 43 y.o. male with a past medical history listed below who presents to the emergency department with inability to move bilateral upper and lower extremities.  Patient reports that he has been having intermittent bilateral leg tingling for several months.  Reports that he was doing fine earlier today and then began having bilateral upper and lower extremity tingling.  He then developed left-sided neck pain.  Reports that he attempted to go upstairs but was unable to.  He reports that he was able to crawl to the living room phone and call EMS for help using his right arm.  Patient denies any falls or trauma.  Denies any recent fevers or infections.  No nausea or vomiting.  No chest pain or shortness of breath.  No abdominal pain.  No diarrhea.  No known sick contacts.  He denies any illicit drug use or alcohol use.  Reports that he had a prior TIA and reports having abnormal MRI several years ago, but that a follow-up MRI this year was unremarkable.  HPI  Past Medical History Past Medical History:  Diagnosis Date  . GERD (gastroesophageal reflux disease)   . Gross hematuria   . History of chest pain   . History of chronic bronchitis   . History of kidney stones   . History of MI (myocardial infarction)    pt states at age 46 had Stress-induced heart attack/  also states had myoview Jan 2016 w/ PCP and it was normal  . History of primary genital syphilis    1997-- treated w/ penicillin injection's  . Hypertension   . Lower urinary tract symptoms (LUTS)   . Prediabetes   . Prostate CA ([place])    hx of prostate CA per pt  . Raynaud's syndrome    Patient Active Problem List   Diagnosis Date Noted  . 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 Right sided weakness 07/09/2015  . HTN (hypertension)  07/09/2015  . GERD (gastroesophageal reflux disease) 07/09/2015  . WEIGHT LOSS 03/28/2009  . GERD 09/22/2008  . POLYURIA 09/22/2008  . VIRAL INFECTION 07/01/2007  . PHARYNGITIS 07/01/2007  . HEMORRHOIDS, EXTERNAL 06/12/2007  . BACK PAIN, CHRONIC 06/11/2007  . SNORING 06/11/2007  . GONORRHEA 03/13/2007  . 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 FATIGUE 09/12/2006  . CHEST PAIN, NON-CARDIAC 09/18/2004  . SYPHILIS 10/08/1995   Home Medication(s) Prior to Admission medications   Medication Sig Start Date End Date Taking? Authorizing Provider  doxycycline (VIBRAMYCIN) 100 MG capsule Take 1 capsule (100 mg total) by mouth 2 (two) times daily. 04/13/19   Sharion Balloon, NP  felodipine (PLENDIL) 2.5 MG 24 hr tablet Take 2.5 mg by mouth daily as needed.     [provider]  tamsulosin (FLOMAX) 0.4 MG CAPS capsule Take 0.4 mg by mouth.    [provider]  Past Surgical History Past Surgical History:  Procedure Laterality Date  . CYSTOSCOPY N/A 02/17/2015   Procedure: CYSTOSCOPY;  Surgeon: Lowella Bandy, MD;  Location: [facility];  Service: Urology;  Laterality: N/A;  . NO PAST SURGERIES     Family History No family history on file.  Social History Social History   Tobacco Use  . Smoking status: Former Smoker    Packs/day: 0.25    Years: 11.00    Pack years: 2.75    Types: Cigarettes    Quit date: 02/16/2000    Years since quitting: 19.6  . Smokeless tobacco: Never Used  Substance Use Topics  . Alcohol use: Yes    Comment: 1 beer/week  . Drug use: No   Allergies Patient has no known allergies.  Review of Systems Review of Systems All other systems are reviewed and are negative for acute change except as noted in the HPI  Physical Exam Vital Signs  I have reviewed the triage vital signs BP (!) 104/59   Pulse  68   Temp 97.7 F (36.5 C) (Oral)   Resp 15   Ht 5\' 10"  (1.778 m)   Wt 7.258 kg   SpO2 100%   BMI 2.30 kg/m   Physical Exam Vitals reviewed.  Constitutional:      General: He is not in acute distress.    Appearance: He is well-developed. He is not diaphoretic.  HENT:     Head: Normocephalic and atraumatic.     Nose: Nose normal.  Eyes:     General: No scleral icterus.       Right eye: No discharge.        Left eye: No discharge.     Conjunctiva/sclera: Conjunctivae normal.     Pupils: Pupils are equal, round, and reactive to light.  Cardiovascular:     Rate and Rhythm: Normal rate and regular rhythm.     Heart sounds: No murmur. No friction rub. No gallop.   Pulmonary:     Effort: Pulmonary effort is normal. No respiratory distress.     Breath sounds: Normal breath sounds. No stridor. No rales.  Abdominal:     General: There is no distension.     Palpations: Abdomen is soft.     Tenderness: There is no abdominal tenderness.  Musculoskeletal:     Cervical back: Normal range of motion and neck supple.     Thoracic back: Spasms and tenderness present.       Back:  Skin:    General: Skin is warm and dry.     Findings: No erythema or rash.  Neurological:     Mental Status: He is alert and oriented to person, place, and time.     Comments: Mental Status:  Alert and oriented to person, place, and time.  Attention and concentration normal.  Speech clear.  Recent memory is intact  Cranial Nerves:  II Visual Fields: Intact to confrontation. Visual fields intact. III, IV, VI: Pupils equal and reactive to light and near. Full eye movement without nystagmus  V Facial Sensation: Normal. No weakness of masticatory muscles  VII: No facial weakness or asymmetry  VIII Auditory Acuity: Grossly normal  IX/X: The uvula is midline; the palate elevates symmetrically  XI: Normal sternocleidomastoid and trapezius strength  XII: The tongue is midline. No atrophy or fasciculations.    Motor System: Muscle Strength: moves slightly with grip and dorsi/plantarflexion. Muscle Tone: Tone and muscle bulk are normal in the upper and lower  extremities.   Reflexes: DTRs: 1+ and symmetrical in all four extremities. No Clonus Coordination: Unable to obtain No tremor.  Sensation: Intact to light touch, and pinprick.  Gait: deferred      ED Results and Treatments Labs (all labs ordered are listed, but only abnormal results are displayed) Labs Reviewed  BASIC METABOLIC PANEL - Abnormal; Notable for the following components:      Result Value   Glucose, Bld 116 (*)    All other components within normal limits  CBG MONITORING, ED - Abnormal; Notable for the following components:   Glucose-Capillary 107 (*)    All other components within normal limits  CBC  URINALYSIS, ROUTINE W REFLEX MICROSCOPIC  ETHANOL  RAPID URINE DRUG SCREEN, HOSP PERFORMED                                                                                                                         EKG  EKG Interpretation  Date/Time:  Thursday October 07 2019 01:36:50 EST Ventricular Rate:  66 PR Interval:    QRS Duration: 84 QT Interval:  376 QTC Calculation: 394 R Axis:   87 Text Interpretation: Sinus rhythm ST elev, probable normal early repol pattern Confirmed by Addison Lank [phone]) on 10/07/2019 3:17:24 AM      Radiology CT HEAD WO CONTRAST  Result Date: 10/07/2019 CLINICAL DATA:  Transient ischemic attack. Inability to move legs and arms. EXAM: CT HEAD WITHOUT CONTRAST TECHNIQUE: Contiguous axial images were obtained from the base of the skull through the vertex without intravenous contrast. COMPARISON:  Head CT and brain MRI 07/09/2015 FINDINGS: Brain: Small subcentimeter lesion in the right frontal lobe on prior MRI has no CT correlate. No intracranial hemorrhage, mass effect, or midline shift. No hydrocephalus. The basilar cisterns are patent. 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 No evidence of territorial infarct or acute ischemia. No extra-axial or intracranial fluid collection. Vascular: No hyperdense vessel or unexpected calcification. Skull: Paranasal sinuses and mastoid air cells are clear. The visualized orbits are unremarkable. Sinuses/Orbits: Defect in the medial right lamina papyracea with herniation of intraorbital fat, unchanged. No acute findings. Other: None. IMPRESSION: No acute intracranial abnormality. Electronically Signed   By: Keith Rake M.D.   On: 10/07/2019 03:03    Pertinent labs & imaging results that were available during my care of the patient were reviewed by me and considered in my medical decision making (see chart for details).  Medications Ordered in ED Medications  sodium chloride flush (NS) 0.9 % injection 3 mL (3 mLs Intravenous Given 10/07/19 0227)  diphenhydrAMINE (BENADRYL) injection 25 mg (25 mg Intravenous Given 10/07/19 0214)  dexamethasone (DECADRON) injection 10 mg (10 mg Intravenous Given 10/07/19 0214)  prochlorperazine (COMPAZINE) injection 10 mg (10 mg Intravenous Given 10/07/19 0214)  Procedures Procedures  (including critical care time)  Medical Decision Making / ED Course I have reviewed the nursing notes for this encounter and the patient's prior records (if available in EHR or on provided paperwork).   TIMOTY SPAUR was evaluated in Emergency Department on 10/07/2019 for the symptoms described in the history of present illness. He was evaluated in the context of the global COVID-19 pandemic, which necessitated consideration that the patient might be at risk for infection with the SARS-CoV-2 virus that causes COVID-19. Institutional protocols and algorithms that pertain to the evaluation of patients at risk for COVID-19 are in a state of rapid change based on information  released by regulatory bodies including the CDC and federal and state organizations. These policies and algorithms were followed during the patient's care in the ED.  On review of system, MRI from 2018 was notable for possible demyelinating lesion.  On care everywhere, MRI from earlier this year did not mention this lesion.  On exam, the patient has tenderness to palpation along the left middle fiber of the trapezius muscle.  He has weakness to bilateral upper and lower extremities.  Patient is able to move the extremity slightly with minimal strength.  He has no evidence showing upper motor neuron process.  Sensations intact.  CT head negative.  Labs grossly reassuring without electrolyte derangements.  UDS negative.  Treated for complex migraine, which completely resolved the patient's symptoms.  Patient was able to move all extremities with good strength and ambulate without complication.  Given his significant improvement, I have low suspicion for demyelinating process requiring further imaging or work-up at this time.  I have low suspicion for CVA/TIA.  The patient appears reasonably screened and/or stabilized for discharge and I doubt any other medical condition or other [facility] requiring further screening, evaluation, or treatment in the ED at this time prior to discharge.  The patient is safe for discharge with strict return precautions.       Final Clinical Impression(s) / ED Diagnoses Final diagnoses:  Complicated migraine     The patient appears reasonably screened and/or stabilized for discharge and I doubt any other medical condition or other [facility] requiring further screening, evaluation, or treatment in the ED at this time prior to discharge.  Disposition: Discharge  Condition: Good  I have discussed the results, Dx and Tx plan with the patient who expressed understanding and agree(s) with the plan. Discharge instructions discussed at great length. The patient was given strict  return precautions who verbalized understanding of the instructions. No further questions at time of discharge.    ED Discharge Orders    None        Follow Up: Nolene Ebbs, MD [place] [address] [phone]  Schedule an appointment as soon as possible for a visit  As needed     This chart was dictated using voice recognition software.  Despite best efforts to proofread,  errors can occur which can change the documentation meaning.     Fatima Blank, MD 10/07/19 [phone]

## 2019-10-07 NOTE — ED Notes (Signed)
Patient verbalizes understanding of discharge instructions. Opportunity for questioning and answers were provided. Armband removed by staff, pt discharged from ED.  

## 2019-10-07 NOTE — ED Notes (Addendum)
Pt advises of improved sensation in extremities, ambulated to and from bathroom with one person assist. Gait unsteady, no major motor deficit noticed.

## 2019-10-07 NOTE — ED Notes (Signed)
Patient transported to CT 

## 2020-02-03 IMAGING — MR MRI LUMBAR SPINE WITHOUT CONTRAST
5 series · 46 of 48 positions shown · non-contrast
Comparison: CT 07/20/2012

CLINICAL DATA: Low back pain

EXAM:
MRI LUMBAR SPINE WITHOUT CONTRAST
TECHNIQUE: Multiplanar, multisequence MR imaging of the lumbar spine was
performed. No intravenous contrast was administered.

[Series 2: tirm sag · sagittal · 4.0mm · 0.55mm/px · 6 of 13 slices shown]
[im 1/13]
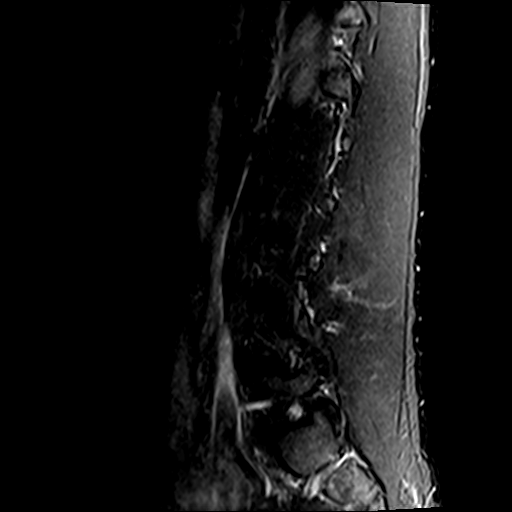
[im 3/13]
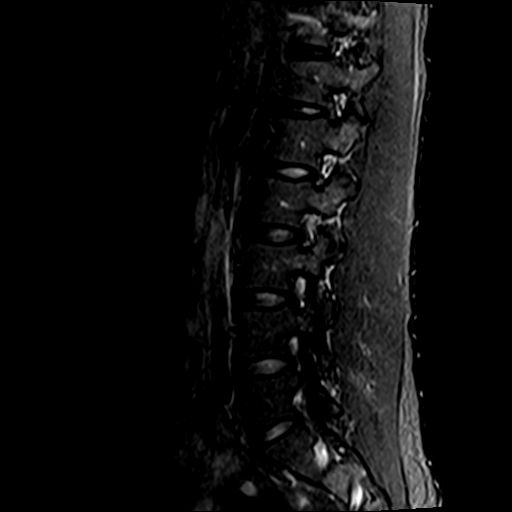
[im 5/13]
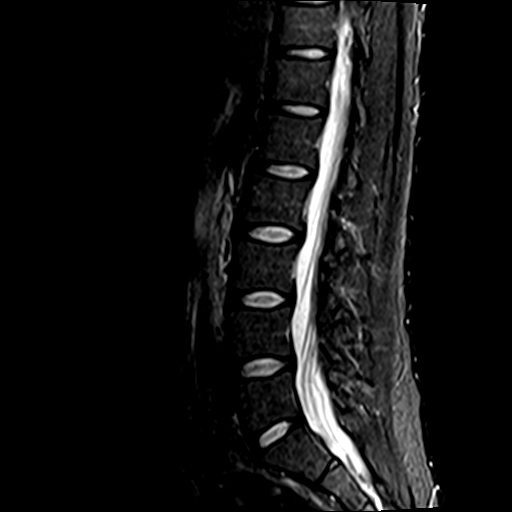
[im 8/13]
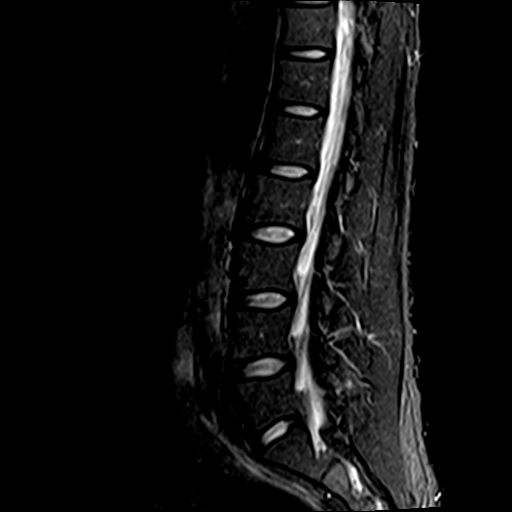
[im 10/13]
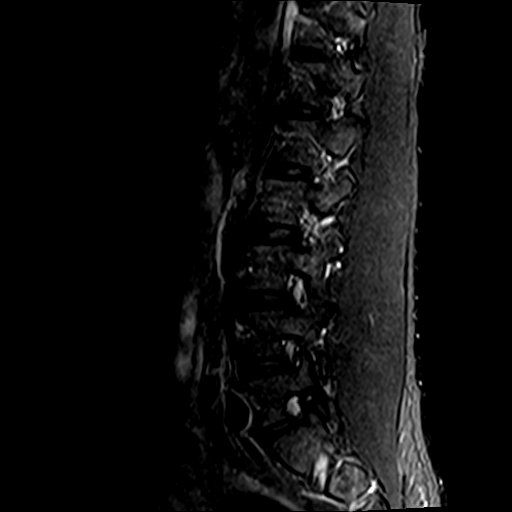
[im 13/13]
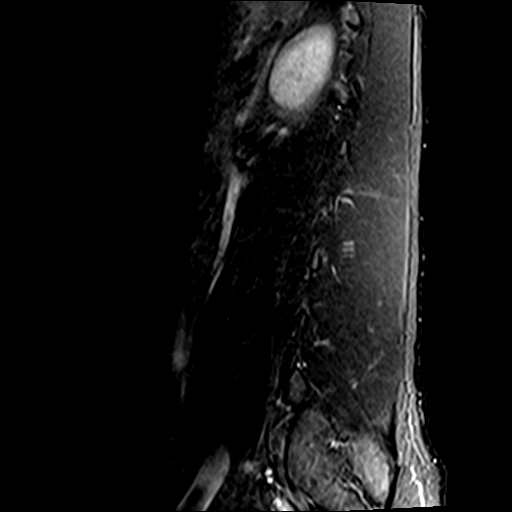

[Series 3: T2 · sagittal · 4.0mm · 0.88mm/px · 5 of 13 slices shown (1 of 2)]
[im 1/13]
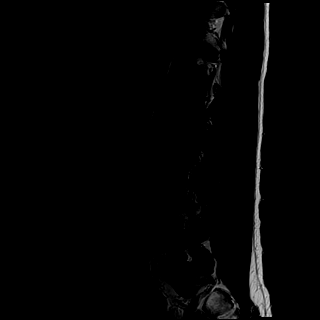
[im 4/13]
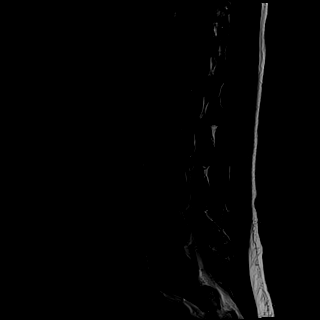
[im 7/13]
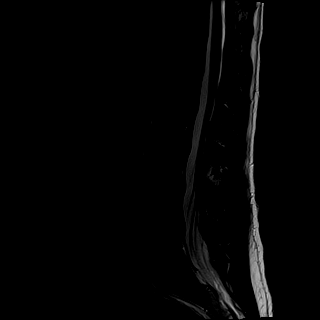
[im 10/13]
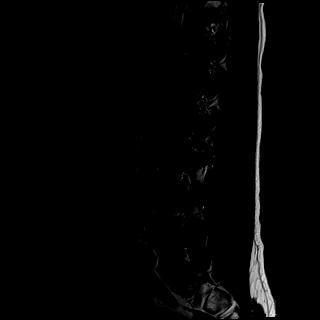
[im 13/13]
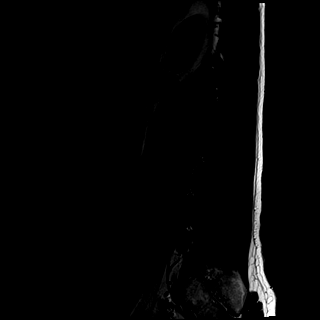

[Series 4: T1 · sagittal · 4.0mm · 0.88mm/px · 5 of 13 slices shown (1 of 2)]
[im 1/13]
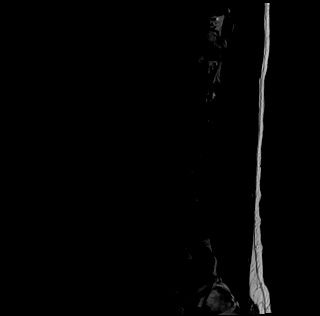
[im 4/13]
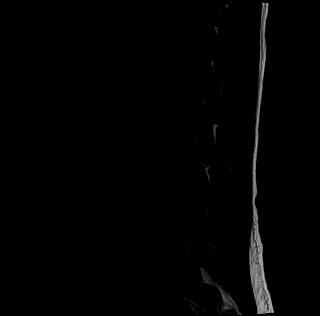
[im 7/13]
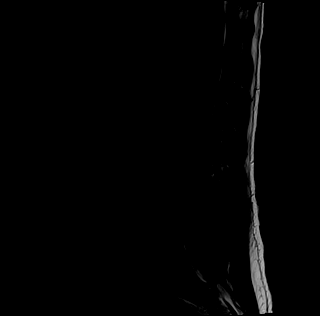
[im 10/13]
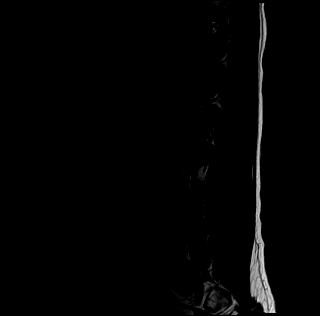
[im 13/13]
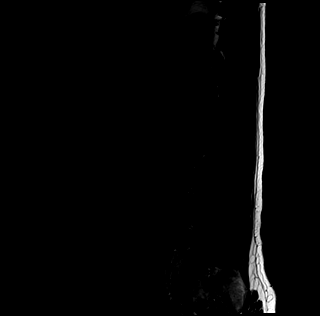

[Series 5: T1 · axial · 4.0mm · 0.78mm/px · z∈[-91,+133]mm · 14 of 38 slices shown (2 of 2)]
[im 1/38]
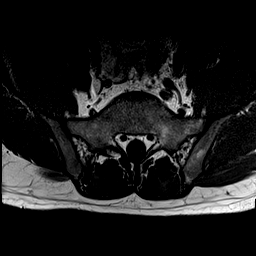
[im 3/38]
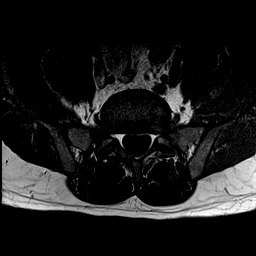
[im 5/38]
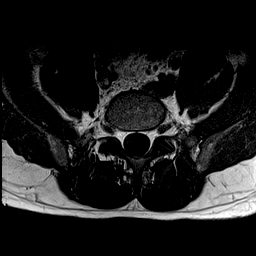
[im 8/38]
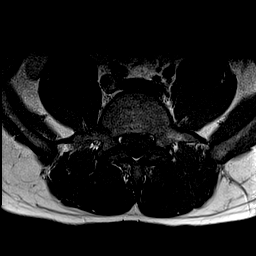
[im 10/38]
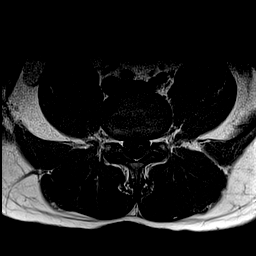
[im 13/38]
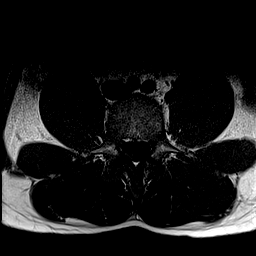
[im 15/38]
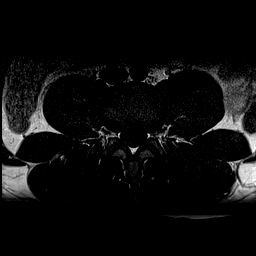
[im 18/38]
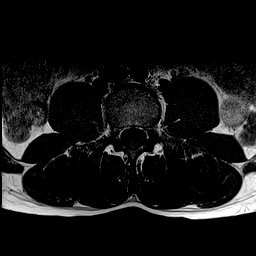
[im 20/38]
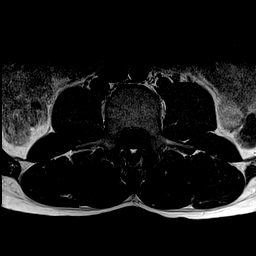
[im 23/38]
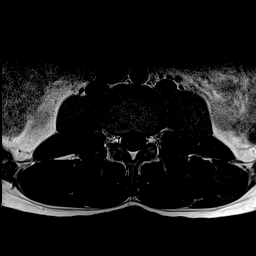
[im 25/38]
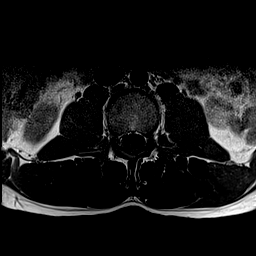
[im 28/38]
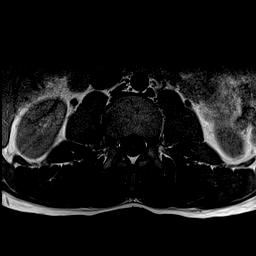
[im 33/38]
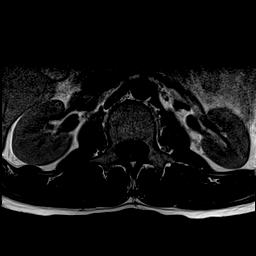
[im 38/38]
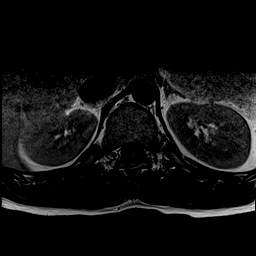

[Series 6: T2 · axial · 4.0mm · 0.78mm/px · z∈[-91,+133]mm · 16 of 38 slices shown (2 of 2)]
[im 1/38]
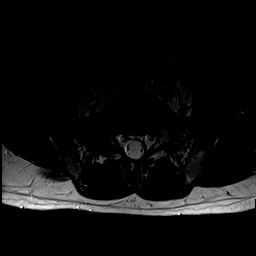
[im 3/38]
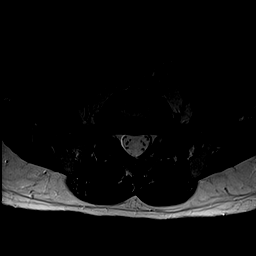
[im 5/38]
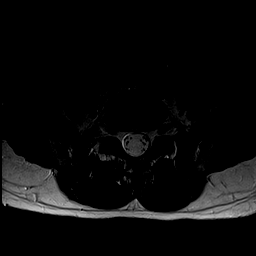
[im 8/38]
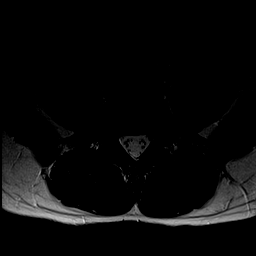
[im 10/38]
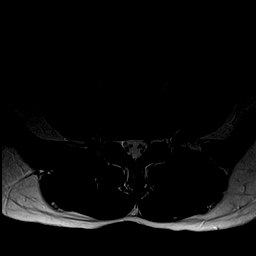
[im 13/38]
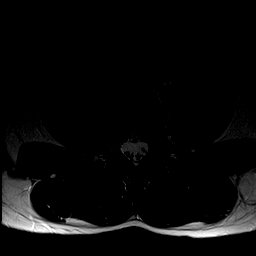
[im 15/38]
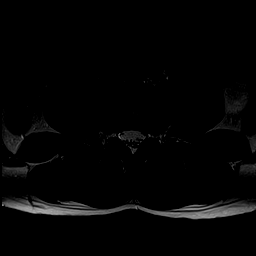
[im 18/38]
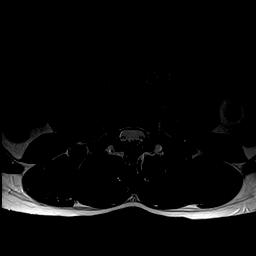
[im 20/38]
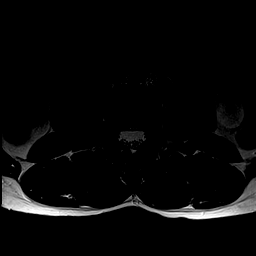
[im 23/38]
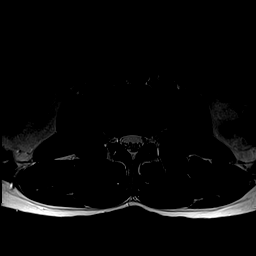
[im 25/38]
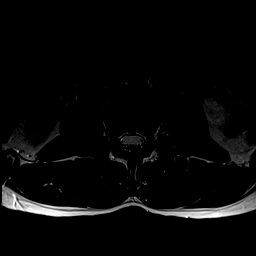
[im 28/38]
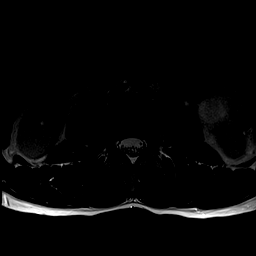
[im 30/38]
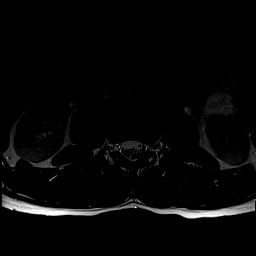
[im 33/38]
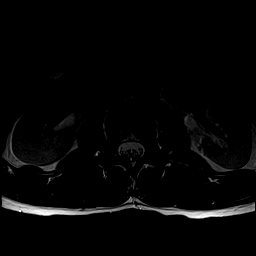
[im 35/38]
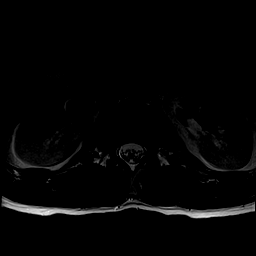
[im 38/38]
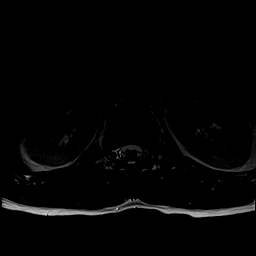

[46 of 48 positions shown; findings below may reference images not displayed]

FINDINGS: Segmentation:  Standard.

Alignment:  Physiologic.

Vertebrae:  No fracture, evidence of discitis, or bone lesion.

Conus medullaris and cauda equina: Conus extends to the L1 level.
Conus and cauda equina appear normal.

Paraspinal and other soft tissues: Negative.

Disc levels:

Intervertebral disc heights are well maintained without disc
desiccation.

The T11-12 through L3-4 levels are unremarkable without focal disc
protrusion, foraminal stenosis, or canal stenosis.

L4-L5: No significant disc protrusion. Mild bilateral facet
arthropathy. No significant foraminal or canal stenosis.

L5-S1: No significant disc protrusion. Mild bilateral facet
arthropathy. No significant foraminal or canal stenosis.
IMPRESSION: Mild facet arthrosis of the L4-5 and L5-S1 levels without
significant foraminal or canal stenosis.

## 2020-10-30 ENCOUNTER — Other Ambulatory Visit: Payer: Self-pay | Admitting: Internal Medicine

## 2020-10-31 LAB — BASIC METABOLIC PANEL WITH GFR
BUN: 12 mg/dL (ref 7–25)
CO2: 24 mmol/L (ref 20–32)
Calcium: 9.2 mg/dL (ref 8.6–10.3)
Chloride: 104 mmol/L (ref 98–110)
Creat: 1.09 mg/dL (ref 0.60–1.35)
GFR, Est African American: 97 mL/min/{1.73_m2} (ref >=60)
GFR, Est Non African American: 83 mL/min/{1.73_m2} (ref >=60)
Glucose, Bld: 67 mg/dL (ref 65–99)
Potassium: 4.3 mmol/L (ref 3.5–5.3)
Sodium: 138 mmol/L (ref 135–146)

## 2020-10-31 LAB — LIPID PANEL
Cholesterol: 160 mg/dL (ref <200)
HDL: 44 mg/dL (ref >=40)
LDL Cholesterol (Calc): 97 mg/dL (calc)
Non-HDL Cholesterol (Calc): 116 mg/dL (calc) (ref <130)
Total CHOL/HDL Ratio: 3.6 (calc) (ref <5.0)
Triglycerides: 91 mg/dL (ref <150)

## 2020-10-31 LAB — CBC
HCT: 43.1 % (ref 38.5–50.0)
Hemoglobin: 15 g/dL (ref 13.2–17.1)
MCH: 31.1 pg (ref 27.0–33.0)
MCHC: 34.8 g/dL (ref 32.0–36.0)
MCV: 89.2 fL (ref 80.0–100.0)
MPV: 10.2 fL (ref 7.5–12.5)
Platelets: 321 10*3/uL (ref 140–400)
RBC: 4.83 10*6/uL (ref 4.20–5.80)
RDW: 12.7 % (ref 11.0–15.0)
WBC: 5.6 10*3/uL (ref 3.8–10.8)

## 2020-10-31 LAB — PSA: PSA: 1.35 ng/mL (ref <=4.0)

## 2022-09-25 ENCOUNTER — Other Ambulatory Visit: Payer: Self-pay | Admitting: Internal Medicine

## 2022-09-26 LAB — URINE CULTURE
MICRO NUMBER:: 14340346
Result:: NO GROWTH
SPECIMEN QUALITY:: ADEQUATE

## 2022-09-26 LAB — CBC
HCT: 45.1 % (ref 38.5–50.0)
Hemoglobin: 15.8 g/dL (ref 13.2–17.1)
MCH: 30.7 pg (ref 27.0–33.0)
MCHC: 35 g/dL (ref 32.0–36.0)
MCV: 87.7 fL (ref 80.0–100.0)
MPV: 10.3 fL (ref 7.5–12.5)
Platelets: 296 10*3/uL (ref 140–400)
RBC: 5.14 10*6/uL (ref 4.20–5.80)
RDW: 12.8 % (ref 11.0–15.0)
WBC: 6.6 10*3/uL (ref 3.8–10.8)

## 2022-09-26 LAB — COMPLETE METABOLIC PANEL WITH GFR
AG Ratio: 1.6 (calc) (ref 1.0–2.5)
ALT: 29 U/L (ref 9–46)
AST: 25 U/L (ref 10–40)
Albumin: 4.7 g/dL (ref 3.6–5.1)
Alkaline phosphatase (APISO): 68 U/L (ref 36–130)
BUN: 9 mg/dL (ref 7–25)
CO2: 24 mmol/L (ref 20–32)
Calcium: 10.1 mg/dL (ref 8.6–10.3)
Chloride: 101 mmol/L (ref 98–110)
Creat: 1.15 mg/dL (ref 0.60–1.29)
Globulin: 3 g/dL (calc) (ref 1.9–3.7)
Glucose, Bld: 91 mg/dL (ref 65–99)
Potassium: 4.5 mmol/L (ref 3.5–5.3)
Sodium: 137 mmol/L (ref 135–146)
Total Bilirubin: 0.6 mg/dL (ref 0.2–1.2)
Total Protein: 7.7 g/dL (ref 6.1–8.1)
eGFR: 80 mL/min/{1.73_m2} (ref >=60)

## 2022-09-26 LAB — LIPID PANEL
Cholesterol: 181 mg/dL (ref <200)
HDL: 55 mg/dL (ref >=40)
LDL Cholesterol (Calc): 104 mg/dL (calc) — ABNORMAL HIGH
Non-HDL Cholesterol (Calc): 126 mg/dL (calc) (ref <130)
Total CHOL/HDL Ratio: 3.3 (calc) (ref <5.0)
Triglycerides: 122 mg/dL (ref <150)

## 2022-09-26 LAB — VITAMIN D 25 HYDROXY (VIT D DEFICIENCY, FRACTURES): Vit D, 25-Hydroxy: 21 ng/mL — ABNORMAL LOW (ref 30–100)

## 2022-09-26 LAB — PSA: PSA: 1.54 ng/mL (ref <=4.00)

## 2022-09-26 LAB — TSH: TSH: 1.49 mIU/L (ref 0.40–4.50)

## 2022-10-02 ENCOUNTER — Other Ambulatory Visit: Payer: Self-pay

## 2022-10-02 ENCOUNTER — Emergency Department (HOSPITAL_BASED_OUTPATIENT_CLINIC_OR_DEPARTMENT_OTHER)
Admission: EM | Admit: 2022-10-02 | Discharge: 2022-10-02 | Disposition: A | Discharge status: Home / Self Care | Payer: Medicaid Other | Attending: Emergency Medicine | Admitting: Emergency Medicine

## 2022-10-02 ENCOUNTER — Encounter (HOSPITAL_BASED_OUTPATIENT_CLINIC_OR_DEPARTMENT_OTHER): Payer: Self-pay

## 2022-10-02 DIAGNOSIS — K0889 Other specified disorders of teeth and supporting structures: Secondary | ICD-10-CM | POA: Diagnosis present

## 2022-10-02 DIAGNOSIS — I10 Essential (primary) hypertension: Secondary | ICD-10-CM | POA: Diagnosis not present

## 2022-10-02 MED ORDER — AMOXICILLIN 500 MG PO CAPS: 500.0000 mg | ORAL_CAPSULE | Freq: Three times a day (TID) | ORAL | 0 refills | Status: DC

## 2022-10-02 NOTE — ED Provider Notes (Signed)
[facility] EMERGENCY DEPT Provider Note   CSN: [id number] Arrival date & time: 10/02/22  1844     History  Chief Complaint  Patient presents with   Dental Problem    George Gonzales is a 46 y.o. male with a past medical history of GERD, hypertension, prediabetes presenting to the emergency room for evaluation of dental pain.  Patient reports he had 4 tooth extractions last week.  He was prescribed some antibiotics however he was not able to pick up at the pharmacy due to birthday error.  Patient stated he has been taking amoxicillin that he got from his neighbor.  He reports taking 6 doses of amoxicillin so far.  He reports increased swelling and pain in his lower jaw.  He reports he has been taking ibuprofen, apply ice for pain relief.  No fever.  He denies any difficulty with swallowing or speaking.  HPI  Past Medical History:  Diagnosis Date   GERD (gastroesophageal reflux disease)    Gross hematuria    History of chest pain    History of chronic bronchitis    History of kidney stones    History of MI (myocardial infarction)    pt states at age 41 had Stress-induced heart attack/  also states had myoview Jan 2016 w/ PCP and it was normal   History of primary genital syphilis    1997-- treated w/ penicillin injection's   Hypertension    Lower urinary tract symptoms (LUTS)    Prediabetes    Prostate CA ([place])    hx of prostate CA per pt   Raynaud's syndrome    Past Surgical History:  Procedure Laterality Date   CYSTOSCOPY N/A 02/17/2015   Procedure: CYSTOSCOPY;  Surgeon: Lowella Bandy, MD;  Location: [facility];  Service: Urology;  Laterality: N/A;   NO PAST SURGERIES       Home Medications Prior to Admission medications   Medication Sig Start Date End Date Taking? Authorizing Provider  amoxicillin (AMOXIL) 500 MG capsule Take 1 capsule (500 mg total) by mouth 3 (three) times daily. 10/02/22  Yes Rex Kras, PA  doxycycline (VIBRAMYCIN) 100 MG  capsule Take 1 capsule (100 mg total) by mouth 2 (two) times daily. 04/13/19   Sharion Balloon, NP  felodipine (PLENDIL) 2.5 MG 24 hr tablet Take 2.5 mg by mouth daily as needed.     [provider]  tamsulosin (FLOMAX) 0.4 MG CAPS capsule Take 0.4 mg by mouth.    [provider]      Allergies    Patient has no known allergies.    Review of Systems   Review of Systems Negative except as per HPI.  Physical Exam Updated Vital Signs BP (!) 125/96   Pulse 75   Temp 98 F (36.7 C) (Oral)   Resp 18   Ht '5\' 10"'$  (1.778 m)   Wt 78.5 kg   SpO2 100%   BMI 24.82 kg/m  Physical Exam Vitals and nursing note reviewed.  Constitutional:      Appearance: Normal appearance.  HENT:     Head: Normocephalic and atraumatic.     Mouth/Throat:     Mouth: Mucous membranes are moist.     Comments: 4 tooth extraction in the left lower jaw with mild swelling and erythema of the gum. Eyes:     General: No scleral icterus. Cardiovascular:     Rate and Rhythm: Normal rate and regular rhythm.     Pulses: Normal pulses.  Heart sounds: Normal heart sounds.  Pulmonary:     Effort: Pulmonary effort is normal.     Breath sounds: Normal breath sounds.  Abdominal:     General: Abdomen is flat.     Palpations: Abdomen is soft.     Tenderness: There is no abdominal tenderness.  Musculoskeletal:        General: No deformity.  Skin:    General: Skin is warm.     Findings: No rash.  Neurological:     General: No focal deficit present.     Mental Status: He is alert.  Psychiatric:        Mood and Affect: Mood normal.     ED Results / Procedures / Treatments   Labs (all labs ordered are listed, but only abnormal results are displayed) Labs Reviewed - No data to display  EKG None  Radiology No results found.  Procedures Procedures    Medications Ordered in ED Medications - No data to display  ED Course/ Medical Decision Making/ A&P                           Medical  Decision Making Risk Prescription drug management.   This patient presents to the ED for dental pain, this involves an extensive number of treatment options, and is a complaint that carries with a high risk of complications and morbidity.  The differential diagnosis includes gingivitis, abscess, dry socket, infectious etiology .  This is not an exhaustive list.  Lab tests:  Imaging studies:  Problem list/ ED course/ Critical interventions/ Medical management: HPI: See above Vital signs within normal range and stable throughout visit. Laboratory/imaging studies significant for: See above. On physical examination, patient is afebrile and appears in no acute distress. Patient presents for dental pain post extraction. Patient not immunosuppressed, afebrile and well appearing with patent airway, have low suspicfion for deep space infection or any concern for airway compromise. Based on history, physical, and work up. No evidence of tooth fracture, avulsion, or bleeding socket. No evidence of RPA, PTA, Ludwig's angina, periapical abscess. Instructed patient to continue to treat pain with ibuprofen/acetaminophen until they see a dentist.  I sent a Rx of amoxicillin for patient to take in 7 days.  Patient discharged home and will follow up with dentist. Discussed return precautions for odontogenic infections and other dental pain emergencies.  I have reviewed the patient home medicines and have made adjustments as needed.  Cardiac monitoring/EKG: The patient was maintained on a cardiac monitor.  I personally reviewed and interpreted the cardiac monitor which showed an underlying rhythm of: sinus rhythm.  Additional history obtained: External records from outside source obtained and reviewed including: Chart review including previous notes, labs, imaging.  Consultations obtained:  Disposition Continued outpatient therapy. Follow-up with a dentist recommended for reevaluation of symptoms. Treatment  plan discussed with patient.  Pt acknowledged understanding was agreeable to the plan. Worrisome signs and symptoms were discussed with patient, and patient acknowledged understanding to return to the ED if they noticed these signs and symptoms. Patient was stable upon discharge.   This chart was dictated using voice recognition software.  Despite best efforts to proofread,  errors can occur which can change the documentation meaning.          Final Clinical Impression(s) / ED Diagnoses Final diagnoses:  Pain, dental    Rx / DC Orders ED Discharge Orders  Ordered    amoxicillin (AMOXIL) 500 MG capsule  3 times daily        10/02/22 2157              Rex Kras, [place] 10/02/22 2340    Isla Pence, MD 10/09/22 [phone]

## 2022-10-02 NOTE — ED Triage Notes (Signed)
Patient here POV from Home.   Endorses 1 Week ago having Dental Work completed. Wisdom Tooth removed and suture placed.  Error occurred at Pharmacy and patient is unable to obtain medications for procedure (Unsure of which medications).   NAD Noted during Triage. A&Ox4. GCS 15. Ambulatory.

## 2022-10-02 NOTE — Discharge Instructions (Addendum)
Please take the amoxicillin as prescribed. Please take tylenol/ibuprofen for pain. I recommend close follow-up with your dentist for reevaluation. Please do not hesitate to return to emergency department if worrisome signs symptoms we discussed become apparent.

## 2022-12-04 LAB — COLOGUARD: COLOGUARD: NEGATIVE

## 2023-03-10 ENCOUNTER — Other Ambulatory Visit: Payer: Self-pay | Admitting: Internal Medicine

## 2023-03-11 LAB — CBC
HCT: 47.3 % (ref 38.5–50.0)
Hemoglobin: 16 g/dL (ref 13.2–17.1)
MCH: 29.8 pg (ref 27.0–33.0)
MCHC: 33.8 g/dL (ref 32.0–36.0)
MCV: 88.1 fL (ref 80.0–100.0)
MPV: 10.4 fL (ref 7.5–12.5)
Platelets: 270 10*3/uL (ref 140–400)
RBC: 5.37 10*6/uL (ref 4.20–5.80)
RDW: 13.1 % (ref 11.0–15.0)
WBC: 6 10*3/uL (ref 3.8–10.8)

## 2023-03-11 LAB — BASIC METABOLIC PANEL WITH GFR
BUN: 12 mg/dL (ref 7–25)
CO2: 25 mmol/L (ref 20–32)
Calcium: 9.5 mg/dL (ref 8.6–10.3)
Chloride: 104 mmol/L (ref 98–110)
Creat: 1.02 mg/dL (ref 0.60–1.29)
Glucose, Bld: 88 mg/dL (ref 65–99)
Potassium: 4 mmol/L (ref 3.5–5.3)
Sodium: 139 mmol/L (ref 135–146)
eGFR: 92 mL/min/{1.73_m2} (ref >=60)

## 2023-03-11 LAB — FOLATE: Folate: 12 ng/mL

## 2023-03-11 LAB — VITAMIN B12: Vitamin B-12: 370 pg/mL (ref 200–1100)

## 2023-03-11 LAB — VITAMIN D 25 HYDROXY (VIT D DEFICIENCY, FRACTURES): Vit D, 25-Hydroxy: 38 ng/mL (ref 30–100)

## 2024-03-31 ENCOUNTER — Emergency Department (HOSPITAL_BASED_OUTPATIENT_CLINIC_OR_DEPARTMENT_OTHER)

## 2024-03-31 ENCOUNTER — Encounter (HOSPITAL_BASED_OUTPATIENT_CLINIC_OR_DEPARTMENT_OTHER): Payer: Self-pay

## 2024-03-31 ENCOUNTER — Emergency Department (HOSPITAL_BASED_OUTPATIENT_CLINIC_OR_DEPARTMENT_OTHER)
Admission: EM | Admit: 2024-03-31 | Discharge: 2024-03-31 | Disposition: A | Discharge status: Home / Self Care | Attending: Emergency Medicine | Admitting: Emergency Medicine

## 2024-03-31 ENCOUNTER — Other Ambulatory Visit: Payer: Self-pay

## 2024-03-31 ENCOUNTER — Emergency Department (HOSPITAL_BASED_OUTPATIENT_CLINIC_OR_DEPARTMENT_OTHER): Admitting: Radiology

## 2024-03-31 DIAGNOSIS — R0789 Other chest pain: Secondary | ICD-10-CM | POA: Insufficient documentation

## 2024-03-31 DIAGNOSIS — R1010 Upper abdominal pain, unspecified: Secondary | ICD-10-CM | POA: Diagnosis not present

## 2024-03-31 DIAGNOSIS — R160 Hepatomegaly, not elsewhere classified: Secondary | ICD-10-CM

## 2024-03-31 DIAGNOSIS — Z79899 Other long term (current) drug therapy: Secondary | ICD-10-CM | POA: Insufficient documentation

## 2024-03-31 DIAGNOSIS — E278 Other specified disorders of adrenal gland: Secondary | ICD-10-CM | POA: Diagnosis not present

## 2024-03-31 LAB — CBC WITH DIFFERENTIAL/PLATELET
Abs Immature Granulocytes: 0.01 10*3/uL (ref 0.00–0.07)
Basophils Absolute: 0.1 10*3/uL (ref 0.0–0.1)
Basophils Relative: 1 %
Eosinophils Absolute: 0.1 10*3/uL (ref 0.0–0.5)
Eosinophils Relative: 1 %
HCT: 44.4 % (ref 39.0–52.0)
Hemoglobin: 15.3 g/dL (ref 13.0–17.0)
Immature Granulocytes: 0 %
Lymphocytes Relative: 28 %
Lymphs Abs: 1.7 10*3/uL (ref 0.7–4.0)
MCH: 30.4 pg (ref 26.0–34.0)
MCHC: 34.5 g/dL (ref 30.0–36.0)
MCV: 88.3 fL (ref 80.0–100.0)
Monocytes Absolute: 0.4 10*3/uL (ref 0.1–1.0)
Monocytes Relative: 7 %
Neutro Abs: 3.8 10*3/uL (ref 1.7–7.7)
Neutrophils Relative %: 63 %
Platelets: 271 10*3/uL (ref 150–400)
RBC: 5.03 MIL/uL (ref 4.22–5.81)
RDW: 12.9 % (ref 11.5–15.5)
WBC: 6 10*3/uL (ref 4.0–10.5)
nRBC: 0 % (ref 0.0–0.2)

## 2024-03-31 LAB — COMPREHENSIVE METABOLIC PANEL WITH GFR
ALT: 24 U/L (ref 0–44)
AST: 24 U/L (ref 15–41)
Albumin: 4.6 g/dL (ref 3.5–5.0)
Alkaline Phosphatase: 68 U/L (ref 38–126)
Anion gap: 10 (ref 5–15)
BUN: 20 mg/dL (ref 6–20)
CO2: 25 mmol/L (ref 22–32)
Calcium: 9.7 mg/dL (ref 8.9–10.3)
Chloride: 104 mmol/L (ref 98–111)
Creatinine, Ser: 1.03 mg/dL (ref 0.61–1.24)
GFR, Estimated: >60 mL/min (ref >60)
Glucose, Bld: 97 mg/dL (ref 70–99)
Potassium: 4.1 mmol/L (ref 3.5–5.1)
Sodium: 138 mmol/L (ref 135–145)
Total Bilirubin: 0.6 mg/dL (ref 0.0–1.2)
Total Protein: 7.5 g/dL (ref 6.5–8.1)

## 2024-03-31 LAB — LIPASE, BLOOD: Lipase: 26 U/L (ref 11–51)

## 2024-03-31 LAB — TROPONIN T, HIGH SENSITIVITY
Troponin T High Sensitivity: <15 ng/L (ref <19)
Troponin T High Sensitivity: <15 ng/L (ref <19)

## 2024-03-31 LAB — D-DIMER, QUANTITATIVE: D-Dimer, Quant: <0.27 ug{FEU}/mL (ref 0.00–0.50)

## 2024-03-31 MED ORDER — ALUM & MAG HYDROXIDE-SIMETH 200-200-20 MG/5ML PO SUSP
30.0000 mL | Freq: Once | ORAL | Status: AC
  Administered 2024-03-31: 30 mL via ORAL
  Filled 2024-03-31: qty 30

## 2024-03-31 MED ORDER — IOHEXOL 350 MG/ML SOLN
100.0000 mL | Freq: Once | INTRAVENOUS | Status: AC | PRN
  Administered 2024-03-31: 100 mL via INTRAVENOUS

## 2024-03-31 MED ORDER — LIDOCAINE VISCOUS HCL 2 % MT SOLN
15.0000 mL | Freq: Once | OROMUCOSAL | Status: AC
  Administered 2024-03-31: 15 mL via OROMUCOSAL
  Filled 2024-03-31: qty 15

## 2024-03-31 MED ORDER — PANTOPRAZOLE SODIUM 40 MG PO TBEC
40.0000 mg | DELAYED_RELEASE_TABLET | Freq: Once | ORAL | Status: AC
  Administered 2024-03-31: 40 mg via ORAL
  Filled 2024-03-31: qty 1

## 2024-03-31 MED ORDER — PANTOPRAZOLE SODIUM 20 MG PO TBEC: 20.0000 mg | DELAYED_RELEASE_TABLET | Freq: Every day | ORAL | 0 refills | Status: AC

## 2024-03-31 NOTE — ED Provider Notes (Signed)
 [place] EMERGENCY DEPARTMENT AT [facility] Provider Note   CSN: [id number] Arrival date & time: 03/31/24  1513     Patient presents with: Chest Pain   George Gonzales is a 48 y.o. male.   Patient here with chest pain has been intermittent for couple months upper abdomen into his back at times.  Denies any weakness numbness tingling.  Denies any cough sputum production.  He has been on muscle relaxants for muscle spasm for this.  Denies any history of high blood pressure hypertension high cholesterol.  Denies any weakness numbness tingling.  Denies any recent surgery or travel.  Pain comes and goes at times.  Admits to some alcohol use at times.  Denies any black or bloody stools.  The history is provided by the patient.       Prior to Admission medications   Medication Sig Start Date End Date Taking? Authorizing Provider  pantoprazole  (PROTONIX ) 20 MG tablet Take 1 tablet (20 mg total) by mouth daily for 14 days. 03/31/24 04/14/24 Yes Armeda Plumb, DO  amoxicillin  (AMOXIL ) 500 MG capsule Take 1 capsule (500 mg total) by mouth 3 (three) times daily. 10/02/22   Ladora Congress, PA  doxycycline  (VIBRAMYCIN ) 100 MG capsule Take 1 capsule (100 mg total) by mouth 2 (two) times daily. 04/13/19   Corlis Burnard DEL, NP  felodipine  (PLENDIL ) 2.5 MG 24 hr tablet Take 2.5 mg by mouth daily as needed.     [provider]  tamsulosin (FLOMAX) 0.4 MG CAPS capsule Take 0.4 mg by mouth.    [provider]    Allergies: Patient has no known allergies.    Review of Systems  Updated Vital Signs BP 116/74   Pulse 68   Temp 98.9 F (37.2 C)   Resp 16   SpO2 98%   Physical Exam Vitals and nursing note reviewed.  Constitutional:      General: He is not in acute distress.    Appearance: He is well-developed. He is not ill-appearing.  HENT:     Head: Normocephalic and atraumatic.   Eyes:     Extraocular Movements: Extraocular movements intact.     Conjunctiva/sclera:  Conjunctivae normal.     Pupils: Pupils are equal, round, and reactive to light.    Cardiovascular:     Rate and Rhythm: Normal rate and regular rhythm.     Pulses:          Radial pulses are 2+ on the right side and 2+ on the left side.     Heart sounds: Normal heart sounds. No murmur heard. Pulmonary:     Effort: Pulmonary effort is normal. No respiratory distress.     Breath sounds: Normal breath sounds. No decreased breath sounds or wheezing.  Abdominal:     Palpations: Abdomen is soft.     Tenderness: There is no abdominal tenderness.   Musculoskeletal:        General: No swelling. Normal range of motion.     Cervical back: Normal range of motion and neck supple.     Right lower leg: No edema.     Left lower leg: No edema.   Skin:    General: Skin is warm and dry.     Capillary Refill: Capillary refill takes less than 2 seconds.   Neurological:     Mental Status: He is alert.   Psychiatric:        Mood and Affect: Mood normal.     (all labs ordered  are listed, but only abnormal results are displayed) Labs Reviewed  CBC WITH DIFFERENTIAL/PLATELET  COMPREHENSIVE METABOLIC PANEL WITH GFR  LIPASE, BLOOD  D-DIMER, QUANTITATIVE (NOT AT [facility])  TROPONIN T, HIGH SENSITIVITY  TROPONIN T, HIGH SENSITIVITY    EKG: EKG Interpretation Date/Time:  Wednesday March 31 2024 15:19:02 EDT Ventricular Rate:  75 PR Interval:  149 QRS Duration:  77 QT Interval:  361 QTC Calculation: 404 R Axis:   97  Text Interpretation: Sinus rhythm Consider right atrial enlargement Confirmed by Ruthe Cornet [id number]) on 03/31/2024 3:21:25 PM  Radiology: CT Angio Chest/Abd/Pel for Dissection W and/or Wo Contrast Result Date: 03/31/2024 CLINICAL DATA:  Chest and back pain. Acute aortic syndrome suspected. EXAM: CT ANGIOGRAPHY CHEST, ABDOMEN AND PELVIS TECHNIQUE: Non-contrast CT of the chest was initially obtained. Multidetector CT imaging through the chest, abdomen and pelvis was performed using  the standard protocol during bolus administration of intravenous contrast. Multiplanar reconstructed images and MIPs were obtained and reviewed to evaluate the vascular anatomy. RADIATION DOSE REDUCTION: This exam was performed according to the departmental dose-optimization program which includes automated exposure control, adjustment of the mA and/or kV according to patient size and/or use of iterative reconstruction technique. CONTRAST:  OMNIPAQUE  IOHEXOL  350 MG/ML SOLN COMPARISON:  Chest CT dated 12/09/2018. FINDINGS: CTA CHEST FINDINGS Cardiovascular: There is no cardiomegaly or pericardial effusion. The thoracic aorta is unremarkable. The origins of the great vessels of the aortic arch are patent. No pulmonary artery embolus identified. Mediastinum/Nodes: No hilar or mediastinal adenopathy. The esophagus and the thyroid gland are grossly unremarkable. No mediastinal fluid collection. Lungs/Pleura: No focal consolidation, pleural effusion, pneumothorax. The central airways are patent. Musculoskeletal: No acute osseous pathology. Review of the MIP images confirms the above findings. CTA ABDOMEN AND PELVIS FINDINGS VASCULAR Aorta: Normal caliber aorta without aneurysm, dissection, vasculitis or significant stenosis. Celiac: Patent without evidence of aneurysm, dissection, vasculitis or significant stenosis. Accessory left hepatic vein from left gastric vein. SMA: Patent without evidence of aneurysm, dissection, vasculitis or significant stenosis. Renals: Both renal arteries are patent without evidence of aneurysm, dissection, vasculitis, fibromuscular dysplasia or significant stenosis. IMA: Patent without evidence of aneurysm, dissection, vasculitis or significant stenosis. Inflow: Patent without evidence of aneurysm, dissection, vasculitis or significant stenosis. Veins: No obvious venous abnormality within the limitations of this arterial phase study. Review of the MIP images confirms the above findings.  NON-VASCULAR No intra-abdominal free air or free fluid. Hepatobiliary: A 1.8 x 2.0 cm enhancing lesion in the anterior liver is not characterized but may represent a flash filling hemangioma or portal venous shunting. This can be better evaluated with MRI on a nonemergent/outpatient basis. No biliary dilatation. The gallbladder is unremarkable. Pancreas: Unremarkable. No pancreatic ductal dilatation or surrounding inflammatory changes. Spleen: Normal in size without focal abnormality. Adrenals/Urinary Tract: The left adrenal glands unremarkable. Indeterminate 2 cm right adrenal nodule. There is no hydronephrosis on either side. The visualized ureters and urinary bladder appear unremarkable. Stomach/Bowel: Mild irregular thickening of the rectal wall may be related to rectal fold or retained stool. Underlying mass is less likely but not excluded. Correlation with clinical exam recommended. There is moderate stool throughout the colon. There is no bowel obstruction or active inflammation. The appendix is normal. Lymphatic: No adenopathy. Reproductive: The prostate and seminal vesicles are grossly unremarkable. No pelvic mass. Other: None Musculoskeletal: No acute or significant osseous findings. Review of the MIP images confirms the above findings. IMPRESSION: 1. No acute intrathoracic, abdominal, or pelvic pathology. No aortic aneurysm or  dissection. 2. Indeterminate hepatic and right adrenal lesions. Further characterization with MRI on a nonemergent/outpatient basis recommended. 3. Mild irregular thickening of the rectal wall may be related to rectal fold or retained stool. Underlying mass is less likely but not excluded. Correlation with clinical exam recommended. Electronically Signed   By: Vanetta Chou M.D.   On: 03/31/2024 18:24   DG Chest 2 View Result Date: 03/31/2024 CLINICAL DATA:  Chest pain EXAM: CHEST - 2 VIEW COMPARISON:  Chest x-ray 12/08/2018 FINDINGS: The heart size and mediastinal contours  are within normal limits. Both lungs are clear. The visualized skeletal structures are unremarkable. IMPRESSION: No active cardiopulmonary disease. Electronically Signed   By: Greig Pique M.D.   On: 03/31/2024 15:55     Procedures   Medications Ordered in the ED  alum & mag hydroxide-simeth (MAALOX/MYLANTA) 200-200-20 MG/5ML suspension 30 mL (30 mLs Oral Given 03/31/24 1633)  pantoprazole  (PROTONIX ) EC tablet 40 mg (40 mg Oral Given 03/31/24 1632)  lidocaine  (XYLOCAINE ) 2 % viscous mouth solution 15 mL (15 mLs Mouth/Throat Given 03/31/24 1634)  iohexol  (OMNIPAQUE ) 350 MG/ML injection 100 mL (100 mLs Intravenous Contrast Given 03/31/24 1756)                                    Medical Decision Making Amount and/or Complexity of Data Reviewed Labs: ordered. Radiology: ordered.  Risk OTC drugs. Prescription drug management.   Manus DELENA Balloon is here with chest pain upper abdominal pain.  Normal vitals.  No fever.  Differential diagnosis likely gastritis versus less likely PE ACS infectious process.  Will evaluate for pancreatitis check his liver and gallbladder enzymes.  Will give GI cocktail.  EKG shows sinus rhythm.  No ischemic changes.  CBC BMP hepatic function panel lipase troponin D-dimer ordered.  Chest x-ray ordered.  Per my review and interpretation the labs shows no significant leukocytosis anemia or electrolyte abnormality.  D-dimer normal.  Troponin negative x 2.  Chest x-ray with no evidence of pneumonia pneumothorax.  Dissection study was ordered which was unremarkable except for may be hepatic and right adrenal lesions.  Will need MRI.  Made aware of this.  Also some sort of irregularity in his rectal area.  He states that he does have hemorrhoids.  Overall we will defer further workup from this regards with gastroenterology which he has been referred to.  Overall I do suspect that this is likely stomach related.  Will put him on Protonix .  Will have him follow-up with primary  care.  Will have him follow-up with GI and cardiology as well.  This chart was dictated using voice recognition software.  Despite best efforts to proofread,  errors can occur which can change the documentation meaning.      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   03/31/24 1841               Ruthe Cornet, DO 03/31/24 1843

## 2024-03-31 NOTE — Discharge Instructions (Addendum)
 Follow-up with cardiology and gastroenterology.  Follow-up with your primary care doctor.  You might need an MRI to further assess an adrenal/liver mass.  Would likely need GI to do a rectal exam on you as well/may be EGD colonoscopy.  Take Protonix  as prescribed for acid reflux.

## 2024-03-31 NOTE — ED Triage Notes (Signed)
 Patient reports chest and back pain for several weeks that have worsened over the last couple days.

## 2024-05-06 ENCOUNTER — Encounter: Payer: Self-pay | Admitting: Gastroenterology

## 2024-05-13 ENCOUNTER — Ambulatory Visit (INDEPENDENT_AMBULATORY_CARE_PROVIDER_SITE_OTHER): Admitting: Gastroenterology

## 2024-05-13 ENCOUNTER — Telehealth: Payer: Self-pay | Admitting: Gastroenterology

## 2024-05-13 ENCOUNTER — Encounter: Payer: Self-pay | Admitting: Gastroenterology

## 2024-05-13 ENCOUNTER — Other Ambulatory Visit

## 2024-05-13 VITALS — BP 100/78 | HR 64 | Ht 69.69 in | Wt 165.0 lb

## 2024-05-13 DIAGNOSIS — K625 Hemorrhage of anus and rectum: Secondary | ICD-10-CM

## 2024-05-13 DIAGNOSIS — R9389 Abnormal findings on diagnostic imaging of other specified body structures: Secondary | ICD-10-CM

## 2024-05-13 DIAGNOSIS — K6289 Other specified diseases of anus and rectum: Secondary | ICD-10-CM

## 2024-05-13 DIAGNOSIS — R194 Change in bowel habit: Secondary | ICD-10-CM | POA: Diagnosis not present

## 2024-05-13 DIAGNOSIS — K219 Gastro-esophageal reflux disease without esophagitis: Secondary | ICD-10-CM

## 2024-05-13 DIAGNOSIS — K602 Anal fissure, unspecified: Secondary | ICD-10-CM | POA: Diagnosis not present

## 2024-05-13 DIAGNOSIS — R0789 Other chest pain: Secondary | ICD-10-CM

## 2024-05-13 DIAGNOSIS — Z8546 Personal history of malignant neoplasm of prostate: Secondary | ICD-10-CM

## 2024-05-13 DIAGNOSIS — K769 Liver disease, unspecified: Secondary | ICD-10-CM

## 2024-05-13 DIAGNOSIS — E279 Disorder of adrenal gland, unspecified: Secondary | ICD-10-CM

## 2024-05-13 LAB — SEDIMENTATION RATE: Sed Rate: 6 mm/h (ref 0–15)

## 2024-05-13 LAB — C-REACTIVE PROTEIN: CRP: <1 mg/dL (ref 0.5–20.0)

## 2024-05-13 MED ORDER — AMBULATORY NON FORMULARY MEDICATION: 0 refills | Status: AC

## 2024-05-13 NOTE — Patient Instructions (Addendum)
 Anal fissure Apply medicated cream prescribed with gloved finger.  No straining with bowel movements Use stool softener with hard stools OTC Miralax as needed for constipation Sitz baths three to four times a day   Your provider has requested that you go to the basement level for lab work before leaving today. Press B on the elevator. The lab is located at the first door on the left as you exit the elevator.  You have been scheduled for an MRI at [facility] on 05/20/24. Your appointment time is 12:00pm. Please arrive to admitting (at main entrance of the hospital) 30 minutes prior to your appointment time for registration purposes. Please make certain not to have anything to eat or drink 6 hours prior to your test. In addition, if you have any metal in your body, have a pacemaker or defibrillator, please be sure to let your ordering physician know. This test typically takes 45 minutes to 1 hour to complete. Should you need to reschedule, please call [phone] to do so.  _______________________________________________________  If your blood pressure at your visit was 140/90 or greater, please contact your primary care physician to follow up on this.  _______________________________________________________  If you are age 68 or older, your body mass index should be between 23-30. Your Body mass index is 23.89 kg/m. If this is out of the aforementioned range listed, please consider follow up with your Primary Care Provider.  If you are age 27 or younger, your body mass index should be between 19-25. Your Body mass index is 23.89 kg/m. If this is out of the aformentioned range listed, please consider follow up with your Primary Care Provider.   ________________________________________________________  The [facility] providers would like to encourage you to use MYCHART to communicate with providers for non-urgent requests or questions.  Due to long hold times on the telephone,  sending your provider a message by [facility] may be a faster and more efficient way to get a response.  Please allow 48 business hours for a response.  Please remember that this is for non-urgent requests.  _______________________________________________________  [facility] is using a team-based approach to care.  Your team is made up of your doctor and two to three APPS. Our APPS (Nurse Practitioners and Physician Assistants) work with your physician to ensure care continuity for you. They are fully qualified to address your health concerns and develop a treatment plan. They communicate directly with your gastroenterologist to care for you. Seeing the [facility] Practitioners on your physician's team can help you by facilitating care more promptly, often allowing for earlier appointments, access to diagnostic testing, procedures, and other specialty referrals.   Thank you for trusting me with your gastrointestinal care. Deanna May, FNP-C

## 2024-05-13 NOTE — Progress Notes (Signed)
 Chief Complaint:idiopathic constipation , abnormal CT scan Primary GI Doctor: Dr. Suzann  HPI:  Patient is a  48  year old male patient with past medical history of GERD, prostate CA (2014-2015), TIA (2016), History of MI (age 46), genital syphilis (1997), Raynaud's disease, fibromyalgia, who was referred to me by Shelda Atlas, MD on 04/27/24 for a evaluation of idiopathic constipation .    03/31/24 seen in ED for chest pain, upper abdominal pain. EKG shows sinus rhythm. D-dimer normal.  Troponin negative x 2.  Chest x-ray with no evidence of pneumonia pneumothorax.  Dissection study was ordered which was unremarkable except for Zakaria Sedor be hepatic and right adrenal lesions. Also some sort of irregularity in his rectal area. Patient put on protonix .Follow-up with GI. Labs show: normal CBC, normal CMP, lipase 26. D dimer <0.27  Interval History     Patient presents for evaluation after recent ED visit for chest pain and abdominal pain. Patient reports he has had left sided chest pain intermittently over the past several years.  He reports it can be worse with certain movements.  He denies current stress.  Patient does have history of GERD but not currently taking any antiacid medication. He reports he controls his symptoms with diet only.  Patient reports he has good appetite and is actually over his normal weight which is 150.    Patient reports the abdominal pain is in the lower pelvic region and radiates to his back.  Patient has history of prostate issues and currently having problems with urinary flow, hematuria, and urge to urinate with no output. He is pending appointment with [facility] or [facility].      Patient reports he has a history of constipation but has not been a issue for quite some time.  Patient states he has bowel movement most days.  Patient has had issues with intermittent rectal bleeding which he states can fluctuate between dark red blood and bright red blood.  Patient reports he is  seeing blood in the stool as well as in the toilet and with wiping.  Patient states last episode was a couple days ago.  Patient has been treated in the past for external hemorrhoids and given Anusol topical ointment which did not provide any relief.  Nonsmoker. Occasional wine, 1 glass per month or less.  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  Two uncles with colon CA, mother with precancerous polyps, father's side with prostate CA.   Patient has appointment with cardiology on October 1st.   Wt Readings from Last 3 Encounters:  05/13/24 165 lb (74.8 kg)  10/02/22 173 lb (78.5 kg)  10/07/19 16 lb (7.258 kg)    Past Medical History:  Diagnosis Date   GERD (gastroesophageal reflux disease)    Gross hematuria    History of chest pain    History of chronic bronchitis    History of kidney stones    History of MI (myocardial infarction)    pt states at age 18 had Stress-induced heart attack/  also states had myoview Jan 2016 w/ PCP and it was normal   History of primary genital syphilis    1997-- treated w/ penicillin  injection's   Hypertension    Lower urinary tract symptoms (LUTS)    Prediabetes    Prostate CA (HCC)    hx of prostate CA per pt   Raynaud's syndrome    TIA (transient ischemic attack) 2016    Past Surgical History:  Procedure  Laterality Date   CYSTOSCOPY N/A 02/17/2015   Procedure: CYSTOSCOPY;  Surgeon: Oliva Oiler, MD;  Location: [facility];  Service: Urology;  Laterality: N/A;   NO PAST SURGERIES      Current Outpatient Medications  Medication Sig Dispense Refill   albuterol  (VENTOLIN  HFA) 108 (90 Base) MCG/ACT inhaler Inhale 2 puffs into the lungs.     AMBULATORY NON FORMULARY MEDICATION Medication Name: Diltiazem 2% with Lidocaine  30 g 0   amoxicillin  (AMOXIL ) 500 MG capsule Take 1 capsule (500 mg total) by mouth 3 (three) times daily. 21 capsule 0   doxycycline  (VIBRAMYCIN ) 100 MG capsule Take 1 capsule (100 mg total) by  mouth 2 (two) times daily. 20 capsule 0   felodipine  (PLENDIL ) 2.5 MG 24 hr tablet Take 2.5 mg by mouth daily as needed.      gabapentin (NEURONTIN) 600 MG tablet Take 600 mg by mouth 3 (three) times daily.     HYDROcodone -acetaminophen  (NORCO/VICODIN) 5-325 MG tablet Take 1 tablet by mouth every 6 (six) hours as needed.     ibuprofen  (ADVIL ) 800 MG tablet Take 800 mg by mouth as needed.     meloxicam (MOBIC) 15 MG tablet Take 15 mg by mouth daily.     omeprazole  (PRILOSEC) 40 MG capsule Take 40 mg by mouth as needed.     tamsulosin (FLOMAX) 0.4 MG CAPS capsule Take 0.4 mg by mouth.     tiZANidine (ZANAFLEX) 4 MG tablet Take 4 mg by mouth 2 (two) times daily.     Vitamin D , Ergocalciferol , (DRISDOL) 1.25 MG (50000 UNIT) CAPS capsule Take 50,000 Units by mouth every 7 (seven) days.     pantoprazole  (PROTONIX ) 20 MG tablet Take 1 tablet (20 mg total) by mouth daily for 14 days. (Patient not taking: Reported on 05/13/2024) 14 tablet 0   No current facility-administered medications for this visit.    Allergies as of 05/13/2024   (No Known Allergies)    Family History  Problem Relation Age of Onset   Colon polyps Mother    Colon cancer Father    Prostate cancer Father    Colon cancer Maternal Uncle    Esophageal cancer Neg Hx     Review of Systems:    Constitutional: No weight loss, fever, chills, weakness or fatigue HEENT: Eyes: No change in vision               Ears, Nose, Throat:  No change in hearing or congestion Skin: No rash or itching Cardiovascular: No chest pain, chest pressure or palpitations   Respiratory: No SOB or cough Gastrointestinal: See HPI and otherwise negative Genitourinary: No dysuria or change in urinary frequency Neurological: No headache, dizziness or syncope Musculoskeletal: No new muscle or joint pain Hematologic: No bleeding or bruising Psychiatric: No history of depression or anxiety    Physical Exam:  Vital signs: BP 100/78 (BP Location: Left Arm,  Patient Position: Sitting, Cuff Size: Normal)   Pulse 64   Ht 5' 9.69 (1.77 m) Comment: height without shoes  Wt 165 lb (74.8 kg)   BMI 23.89 kg/m   Constitutional:   Pleasant  male appears to be in NAD, Well developed, Well nourished, alert and cooperative Throat: Oral cavity and pharynx without inflammation, swelling or lesion.  Respiratory: Respirations even and unlabored. Lungs clear to auscultation bilaterally.   No wheezes, crackles, or rhonchi.  Cardiovascular: Normal S1, S2. Regular rate and rhythm. No peripheral edema, cyanosis or pallor.  Gastrointestinal:  Soft, nondistended, nontender.  No rebound or guarding. Normal bowel sounds. No appreciable masses or hepatomegaly. Rectal: rectal exam shows external skin tag, normal rectal tone, anal fissure noted posterior, tender, no masses, , brown stool,  Chaperone Denise Anoscopy: Tenderness with exam, posterior fissure noted.  No internal hemorrhoids noted, difficult exam Msk:  Symmetrical without gross deformities. Without edema, no deformity or joint abnormality.  Neurologic:  Alert and  oriented x4;  grossly normal neurologically.  Skin:   Dry and intact without significant lesions or rashes.  RELEVANT LABS AND IMAGING: CBC    Latest Ref Rng & Units 03/31/2024    3:24 PM 03/10/2023   12:00 AM 09/25/2022    4:00 PM  CBC  WBC 4.0 - 10.5 K/uL 6.0  6.0  6.6   Hemoglobin 13.0 - 17.0 g/dL 84.6  83.9  84.1   Hematocrit 39.0 - 52.0 % 44.4  47.3  45.1   Platelets 150 - 400 K/uL 271  270  296      CMP     Latest Ref Rng & Units 03/31/2024    3:24 PM 03/10/2023   12:00 AM 09/25/2022    4:00 PM  CMP  Glucose 70 - 99 mg/dL 97  88  91   BUN 6 - 20 mg/dL 20  12  9    Creatinine 0.61 - 1.24 mg/dL 8.96  8.97  8.84   Sodium 135 - 145 mmol/L 138  139  137   Potassium 3.5 - 5.1 mmol/L 4.1  4.0  4.5   Chloride 98 - 111 mmol/L 104  104  101   CO2 22 - 32 mmol/L 25  25  24    Calcium 8.9 - 10.3 mg/dL 9.7  9.5  89.8   Total Protein 6.5 - 8.1  g/dL 7.5   7.7   Total Bilirubin 0.0 - 1.2 mg/dL 0.6   0.6   Alkaline Phos 38 - 126 U/L 68     AST 15 - 41 U/L 24   25   ALT 0 - 44 U/L 24   29      Lab Results  Component Value Date   TSH 1.49 09/25/2022  11/22/22 Cologuard negative    03/31/24 CT angio IMPRESSION: 1. No acute intrathoracic, abdominal, or pelvic pathology. No aortic aneurysm or dissection. 2. Indeterminate hepatic and right adrenal lesions. Further characterization with MRI on a nonemergent/outpatient basis recommended. 3. Mild irregular thickening of the rectal wall Amish Mintzer be related to rectal fold or retained stool. Underlying mass is less likely but not excluded. Correlation with clinical exam recommended.   Assessment: Encounter Diagnoses  Name Primary?   Rectal bleeding Yes   Anal fissure    Rectal pain    Altered bowel habits    Abnormal CT scan    Hepatic lesion    Lesion of adrenal gland (HCC)    History of prostate cancer    Chest discomfort    Gastroesophageal reflux disease, unspecified whether esophagitis present      48 year old male patient who presents for abnormal CT scan which showed determinate hepatic and right adrenal lesions and mildly irregular thickening of the rectal wall.  Patient reports history of prostate cancer but denies ever receiving radiation or chemotherapy.  History unclear?  Will go ahead and proceed with MRI abdomen and pelvis to further evaluate the hepatic and right renal lesion.    For the mild irregular thickening of the rectal wall in addition to patient's reported rectal bleeding and altered bowel habits I do  recommend patient proceed with colonoscopy.  We would need patient to receive clearance from cardiology with recent chest pain and reported history of cardiac issues.  Patient has appointment to see cardiology in October.  Upon physical exam did note posterior anal fissure we will go ahead and prescribe patient topical diltiazem and lidocaine  to apply as well as  educated on sitz bath's, stool softeners, and over-the-counter MiraLAX.      Patient reports chronic chest pain not relieved with PPI therapy or strict GERD diet.  Patient pending cardiology evaluation in October.  Will reevaluate once workup is complete.  Starlet Gallentine consider EGD add on.  Plan: - Prescribe topical with diltiazem and lidocaine  apply rectally  -Recommend sitz baths TID -stool softeners as needed -Over-the-counter MiraLAX -Reinforced GERD diet, no late meals -order MRI abdomen/pelvis to evaluate hepatic and right adrenal lesions -defer colonoscopy until cardiac clearance in October  Thank you for the courtesy of this consult. Please call me with any questions or concerns.   Solon Alban, FNP-C [facility] 05/13/2024, 1:12 PM  Cc: Shelda Atlas, MD  I have reviewed the clinic note as outlined by Cathryne Beal, NP and agree with the assessment, plan and medical decision making.  Mr. Ishler presents for evaluation of change in bowel habits, rectal bleeding as well as imaging showing indeterminate hepatic and adrenal lesion as well as rectal wall thickening.  Reports a prior history of prostate cancer but no radiation or chemotherapy.  There is a family history of colorectal cancer in multiple second-degree relatives.  He has had cardiac issues with chest pain.  Agree that colonoscopy would be reasonable in the future but he should be evaluated by cardiology first.  If there are no cardiac etiologies for his chest pain can consider EGD at the time of his future colonoscopy.  Agree with proceeding with MRI of the abdomen pelvis for further delineation of abdominal lesions seen on CT.  Inocente Hausen, MD

## 2024-05-13 NOTE — Telephone Encounter (Signed)
 [employer] is calling in regards to a compound medication due to it not having instruction for the patient to read on how to administer the medication. A good call back number for them is [phone] re name is George Gonzales. Please advise.

## 2024-05-17 ENCOUNTER — Ambulatory Visit: Payer: Self-pay | Admitting: Gastroenterology

## 2024-05-20 ENCOUNTER — Ambulatory Visit (HOSPITAL_COMMUNITY): Admission: RE | Admit: 2024-05-20 | Source: Ambulatory Visit

## 2024-05-24 ENCOUNTER — Ambulatory Visit (HOSPITAL_COMMUNITY)
Admission: RE | Admit: 2024-05-24 | Discharge: 2024-05-24 | Disposition: A | Discharge status: Home / Self Care | Source: Ambulatory Visit | Attending: Gastroenterology | Admitting: Gastroenterology

## 2024-05-24 DIAGNOSIS — K769 Liver disease, unspecified: Secondary | ICD-10-CM | POA: Insufficient documentation

## 2024-05-24 DIAGNOSIS — E279 Disorder of adrenal gland, unspecified: Secondary | ICD-10-CM | POA: Insufficient documentation

## 2024-05-24 DIAGNOSIS — R9389 Abnormal findings on diagnostic imaging of other specified body structures: Secondary | ICD-10-CM | POA: Insufficient documentation

## 2024-05-24 DIAGNOSIS — K625 Hemorrhage of anus and rectum: Secondary | ICD-10-CM | POA: Diagnosis present

## 2024-05-24 DIAGNOSIS — Z8546 Personal history of malignant neoplasm of prostate: Secondary | ICD-10-CM | POA: Insufficient documentation

## 2024-05-24 MED ORDER — GADOBUTROL 1 MMOL/ML IV SOLN
7.5000 mL | Freq: Once | INTRAVENOUS | Status: AC | PRN
  Administered 2024-05-24: 7.5 mL via INTRAVENOUS

## 2024-06-21 ENCOUNTER — Ambulatory Visit: Attending: Internal Medicine | Admitting: Internal Medicine

## 2024-06-21 VITALS — BP 110/70 | HR 66 | Ht 70.0 in | Wt 167.6 lb

## 2024-06-21 DIAGNOSIS — R0789 Other chest pain: Secondary | ICD-10-CM | POA: Diagnosis not present

## 2024-06-21 NOTE — Progress Notes (Signed)
 OFFICE NOTE  Chief Complaint:  Follow-up chest pain  Primary Care Physician: Shelda Atlas, MD  HPI:  George Gonzales is a 48 y.o. male with a past medial history significant for chest pain.  He was seen in the emergency department in June 2025 with central chest pain.  He reported a sharp and stabbing and then at times dull and more burning.  He says sometimes it is associated with left knee pain.  He reports the episodes are intermittent and not necessarily worse with exertion or relieved by rest but sometimes occur when doing some physical activity.  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  He reports some later onset heart disease in the family but more significantly cancer.  He does have an unusual history with TIA back in 2016.  He was also told at 1 point he might of had stress related to heart attack in the past.  He did have nuclear imaging which was negative.  Since he was seen in the emergency department he has not really had any significant recurrent episodes that were worse with exertion or relieved by rest.  Repeat EKG was performed today which shows normal sinus rhythm, unchanged from his EKG in the ER.  Workup there included negative troponins x 2, negative D-dimer, negative sed rate and CRP, and CT coronary angiography which failed to show any coronary calcification or aortic pathology.  PMHx:  Past Medical History:  Diagnosis Date   GERD (gastroesophageal reflux disease)    Gross hematuria    History of chest pain    History of chronic bronchitis    History of kidney stones    History of MI (myocardial infarction)    pt states at age 64 had Stress-induced heart attack/  also states had myoview Jan 2016 w/ PCP and it was normal   History of primary genital syphilis    1997-- treated w/ penicillin  injection's   Hypertension    Lower urinary tract symptoms (LUTS)    Prediabetes    Prostate CA (HCC)    hx of prostate CA per pt    Raynaud's syndrome    TIA (transient ischemic attack) 2016    Past Surgical History:  Procedure Laterality Date   CYSTOSCOPY N/A 02/17/2015   Procedure: CYSTOSCOPY;  Surgeon: Oliva Oiler, MD;  Location: [facility];  Service: Urology;  Laterality: N/A;   NO PAST SURGERIES      FAMHx:  Family History  Problem Relation Age of Onset   Colon polyps Mother    Colon cancer Father    Prostate cancer Father    Colon cancer Maternal Uncle    Esophageal cancer Neg Hx     SOCHx:   reports that he quit smoking about 24 years ago. His smoking use included cigarettes. He started smoking about 35 years ago. He has a 2.8 pack-year smoking history. He has never used smokeless tobacco. He reports current alcohol use of about 1.0 standard drink of alcohol per week. He reports that he does not use drugs.  ALLERGIES:  No Known Allergies  ROS: Pertinent items noted in HPI and remainder of comprehensive ROS otherwise negative.  HOME MEDS: Current Outpatient Medications on File Prior to Visit  Medication Sig Dispense Refill   amoxicillin  (AMOXIL ) 500 MG capsule Take 1 capsule (500 mg total) by mouth 3 (three) times daily. (Patient taking differently: Take 500 mg by mouth as needed.) 21 capsule 0   doxycycline  (VIBRAMYCIN ) 100 MG  capsule Take 1 capsule (100 mg total) by mouth 2 (two) times daily. (Patient taking differently: Take 100 mg by mouth as needed.) 20 capsule 0   gabapentin (NEURONTIN) 600 MG tablet Take 600 mg by mouth 3 (three) times daily.     levocetirizine (XYZAL) 5 MG tablet Take 5 mg by mouth daily. (Patient taking differently: Take 5 mg by mouth as needed.)     meloxicam (MOBIC) 15 MG tablet Take 15 mg by mouth daily.     tamsulosin (FLOMAX) 0.4 MG CAPS capsule Take 0.4 mg by mouth.     tiZANidine (ZANAFLEX) 4 MG tablet Take 4 mg by mouth 2 (two) times daily.     Vitamin D , Ergocalciferol , (DRISDOL) 1.25 MG (50000 UNIT) CAPS capsule Take 50,000 Units by mouth every 7  (seven) days.     albuterol  (VENTOLIN  HFA) 108 (90 Base) MCG/ACT inhaler Inhale 2 puffs into the lungs. (Patient not taking: Reported on 06/21/2024)     AMBULATORY NON FORMULARY MEDICATION Medication Name: Diltiazem 2% with Lidocaine  (Patient not taking: Reported on 06/21/2024) 30 g 0   felodipine  (PLENDIL ) 2.5 MG 24 hr tablet Take 2.5 mg by mouth daily as needed.  (Patient not taking: Reported on 06/21/2024)     HYDROcodone -acetaminophen  (NORCO/VICODIN) 5-325 MG tablet Take 1 tablet by mouth every 6 (six) hours as needed. (Patient not taking: Reported on 06/21/2024)     ibuprofen  (ADVIL ) 800 MG tablet Take 800 mg by mouth as needed. (Patient not taking: Reported on 06/21/2024)     omeprazole  (PRILOSEC) 40 MG capsule Take 40 mg by mouth as needed. (Patient not taking: Reported on 06/21/2024)     pantoprazole  (PROTONIX ) 20 MG tablet Take 1 tablet (20 mg total) by mouth daily for 14 days. (Patient not taking: Reported on 05/13/2024) 14 tablet 0   No current facility-administered medications on file prior to visit.    LABS/IMAGING: No results found for this or any previous visit (from the past 48 hours). No results found.  LIPID PANEL:    Component Value Date/Time   CHOL 181 09/25/2022 1600   TRIG 122 09/25/2022 1600   HDL 55 09/25/2022 1600   CHOLHDL 3.3 09/25/2022 1600   VLDL 8 07/10/2015 0430   LDLCALC 104 (H) 09/25/2022 1600     WEIGHTS: Wt Readings from Last 3 Encounters:  06/21/24 167 lb 9.6 oz (76 kg)  05/13/24 165 lb (74.8 kg)  10/02/22 173 lb (78.5 kg)    VITALS: BP 110/70 (BP Location: Left Arm, Patient Position: Sitting, Cuff Size: Normal)   Pulse 66   Ht 5' 10 (1.778 m)   Wt 167 lb 9.6 oz (76 kg)   SpO2 98%   BMI 24.05 kg/m   EXAM: General appearance: alert and no distress Lungs: clear to auscultation bilaterally Heart: regular rate and rhythm, S1, S2 normal, no murmur, click, rub or gallop Extremities: extremities normal, atraumatic, no cyanosis or edema Neurologic:  Grossly normal  EKG: EKG Interpretation Date/Time:  Monday June 21 2024 14:07:46 EDT Ventricular Rate:  66 PR Interval:  140 QRS Duration:  76 QT Interval:  380 QTC Calculation: 398 R Axis:   53  Text Interpretation: Normal sinus rhythm Normal ECG When compared with ECG of 31-Mar-2024 15:19, No significant change since last tracing Confirmed by Mona Kent [phone]) on 06/21/2024 2:28:10 PM - personally reviewed  ASSESSMENT: Noncardiac chest pain  PLAN: 1.   Mr. Eastridge has few cardiac risk factors and is describing what seems like a noncardiac chest pain.  There are some different qualities to the chest pain and it is not always associated with exertion or relieved by rest.  The symptoms are quite sporadic and sometimes are associated with concomitant left knee pain.  His PCP suggests he might have a fibromyalgia or musculoskeletal pain.  I would agree that I think this is unlikely to be a cardiac chest pain and the lack of coronary calcium and negative testing during his recent ER visit is reassuring.  I do not feel that further testing is required at this time.  We are certainly happy to see him back on an as-needed basis if his symptoms worsen or occur more frequently with exertion.  Thanks for the kind referral.  Vinie KYM Maxcy, MD, [facility], FNLA, FACP  [place]  [facility]  Medical Director of the Advanced Lipid Disorders &  Cardiovascular Risk Reduction Clinic Diplomate of the American Board of Clinical Lipidology Attending Cardiologist  Direct Dial: [phone]  Fax: [phone]  Website:  [email or web address] 06/21/2024, 3:13 PM

## 2024-06-21 NOTE — Patient Instructions (Signed)
 Medication Instructions:  NO CHANGES  *If you need a refill on your cardiac medications before your next appointment, please call your pharmacy*  Follow-Up: At [facility], you and your health needs are our priority.  As part of our continuing mission to provide you with exceptional heart care, our providers are all part of one team.  This team includes your primary Cardiologist (physician) and Advanced Practice Providers or APPs (Physician Assistants and Nurse Practitioners) who all work together to provide you with the care you need, when you need it.  Your next appointment:    AS NEEDED with Dr. Maximo Spar  We recommend signing up for the patient portal called "MyChart".  Sign up information is provided on this After Visit Summary.  MyChart is used to connect with patients for Virtual Visits (Telemedicine).  Patients are able to view lab/test results, encounter notes, upcoming appointments, etc.  Non-urgent messages can be sent to your provider as well.   To learn more about what you can do with MyChart, go to [email or web address].

## 2024-07-07 ENCOUNTER — Ambulatory Visit (HOSPITAL_BASED_OUTPATIENT_CLINIC_OR_DEPARTMENT_OTHER): Admitting: Cardiology

## 2024-07-14 ENCOUNTER — Telehealth: Payer: Self-pay | Admitting: Gastroenterology

## 2024-07-14 ENCOUNTER — Telehealth: Payer: Self-pay

## 2024-07-14 ENCOUNTER — Encounter: Payer: Self-pay | Admitting: Pediatrics

## 2024-07-14 NOTE — Telephone Encounter (Signed)
 He is scheduled for 08-19-2024, but advised if we are not able to obtain clearance before this date that an office visit would be needed.Tells me due to work it is difficult to schedule time off, so he just wants something on the books. I would be glad to call him and reschedule if needed, please advise.

## 2024-07-14 NOTE — Telephone Encounter (Signed)
   Patient Name: George Gonzales  DOB: 12/31/1975 MRN: [id number]  Primary Cardiologist: None  Chart reviewed as part of pre-operative protocol coverage. Given past medical history and time since last visit, based on ACC/AHA guidelines, CORDARIOUS ZEEK is at acceptable risk for the planned procedure without further cardiovascular testing.   Per Dr. Mona on 06/21/2024 [name] has few cardiac risk factors and is describing what seems like a noncardiac chest pain. There are some different qualities to the chest pain and it is not always associated with exertion or relieved by rest. The symptoms are quite sporadic and sometimes are associated with concomitant left knee pain. His PCP suggests he might have a fibromyalgia or musculoskeletal pain. I would agree that I think this is unlikely to be a cardiac chest pain and the lack of coronary calcium and negative testing during his recent ER visit is reassuring. I do not feel that further testing is required at this time. We are certainly happy to see him back on an as-needed basis if his symptoms worsen or occur more frequently with exertion.   The patient was advised that if he develops new symptoms prior to surgery to contact our office to arrange for a follow-up visit, and he verbalized understanding.  I will route this recommendation to the requesting party via Epic fax function and remove from pre-op pool.  Please call with questions.  Lamarr Satterfield, NP 07/14/2024, 3:47 PM

## 2024-07-14 NOTE — Telephone Encounter (Signed)
 Medical Clearance sent to Cardiology. Left message for pt to call back

## 2024-07-14 NOTE — Telephone Encounter (Signed)
 [facility] Pre-operative Risk Assessment     Request for surgical clearance:     Endoscopy Procedure  What type of surgery is being performed?     Colonoscopy  When is this surgery scheduled?     08/19/2024  What type of clearance is required ?   Medical   Are there any medications that need to be held prior to surgery and how long? N/A  Practice name and name of physician performing surgery?      [facility]/ Dr. Suzann   What is your office phone and fax number?      Phone- [phone]  Fax- [phone]  Anesthesia type (None, local, MAC, general) ?       MAC   Please route your response to Elspeth Munroe RN

## 2024-07-15 NOTE — Telephone Encounter (Signed)
 Refer to separate phone note from yesterday.

## 2024-07-15 NOTE — Telephone Encounter (Signed)
 Clearance received in separate phone note from 07/14/24. Left detailed message for patient that we received clearance & okay for him to proceed with colon on 11/13 as scheduled, and to call back with any questions/concerns.

## 2024-07-29 ENCOUNTER — Ambulatory Visit: Admitting: *Deleted

## 2024-07-29 VITALS — Ht 70.0 in | Wt 169.0 lb

## 2024-07-29 DIAGNOSIS — Z1211 Encounter for screening for malignant neoplasm of colon: Secondary | ICD-10-CM

## 2024-07-29 MED ORDER — PEG 3350-KCL-NA BICARB-NACL 420 G PO SOLR: 4000.0000 mL | Freq: Once | ORAL | 0 refills | Status: AC

## 2024-07-29 NOTE — Progress Notes (Signed)
 Pt's name and DOB verified at the beginning of the pre-visit with 2 identifiers  Pt denies any difficulty with ambulating,sitting, laying down or rolling side to side  Pt has no issues moving head neck or swallowing  No egg or soy allergy known to patient   No issues known to pt with past sedation  No FH of Malignant Hyperthermia  Pt is not on home 02   Pt is not on blood thinners   Pt has frequent issues with constipation RN instructed pt to use Miralax per bottles instructions a week before prep days. Pt states they will  Pt is not on dialysis  Pt denise any abnormal heart rhythms   Pt denies any upcoming cardiac testing  Patient's chart reviewed by Norleen Schillings CNRA prior to pre-visit and patient appropriate for the [facility].  Pre-visit completed and red dot placed by patient's name on their procedure day (on provider's schedule).    Visit by phone  Pt states weight is 169 lb  Pt given  both [facility] main # and MD on call # prior to instructions.  Informed pt to come in at the time discussed and is shown on PV instructions.  Pt instructed to use [employer] or GoodRx for a price reduction on prep  Instructed pt where to find PV instructions in My Ch. Copy of instructions  to be sent in mail and address read back to pt to verify correct on envelope. Instructed pt on all aspects of written instructions including med holds clothing to wear and foods to eat and not eat as well as after procedure legal restrictions and to call MD on call if needed.. Pt states understanding. Instructed pt to review instructions again prior to procedure and call main # given if has any questions or any issues. Pt states they will.

## 2024-08-17 NOTE — Progress Notes (Unsigned)
 [facility] History and Physical   Primary Care Physician:  Shelda Atlas, MD   Reason for Procedure:  Abdominal pain, change in bowel habits, bright red blood per rectum, CT scan with rectal wall thickening  Plan:    Colonoscopy   The patient was provided an opportunity to ask questions and all were answered. The patient agreed with the plan.   HPI: George Gonzales is a 48 y.o. male undergoing colonoscopy for evaluation of abdominal pain, change in bowel habits, bright red blood per rectum and a CT scan showing rectal wall thickening.  Patient has a history of constipation with intermittent rectal bleeding fluctuating between dark red blood and bright red blood.  Treated for anal fissure after office visit in August 2025.  CT scan initially showed rectal wall thickening.  A subsequent MRI obtained to evaluate liver lesion did not show any rectal abnormalities.  Patient reports a pertinent family history of colorectal cancer in multiple second-degree relatives; other has had colon polyps   Past Medical History:  Diagnosis Date   Arthritis    CHF (congestive heart failure) (HCC)    Constipation    COPD (chronic obstructive pulmonary disease) (HCC)    GERD (gastroesophageal reflux disease)    Gross hematuria    History of chest pain    History of chronic bronchitis    History of kidney stones    History of MI (myocardial infarction)    pt states at age 89 had Stress-induced heart attack/  also states had myoview Jan 2016 w/ PCP and it was normal   History of primary genital syphilis    1997-- treated w/ penicillin  injection's   Hyperlipidemia    Hypertension    Hypotension    Lower urinary tract symptoms (LUTS)    Myocardial infarction (HCC)    Neuromuscular disorder (HCC)    Prediabetes    Prostate CA (HCC)    hx of prostate CA per pt   Raynaud's syndrome    TIA (transient ischemic attack) 2016    Past Surgical History:  Procedure Laterality Date    CYSTOSCOPY N/A 02/17/2015   Procedure: CYSTOSCOPY;  Surgeon: Oliva Oiler, MD;  Location: [facility];  Service: Urology;  Laterality: N/A;   NO PAST SURGERIES      Prior to Admission medications   Medication Sig Start Date End Date Taking? Authorizing Provider  albuterol  (VENTOLIN  HFA) 108 (90 Base) MCG/ACT inhaler Inhale 2 puffs into the lungs. Patient taking differently: Inhale 2 puffs into the lungs as needed. 11/21/18   [provider]  AMBULATORY NON FORMULARY MEDICATION Medication Name: Diltiazem 2% with Lidocaine  05/13/24   May, Deanna J, NP  amoxicillin  (AMOXIL ) 500 MG capsule Take 1 capsule (500 mg total) by mouth 3 (three) times daily. Patient not taking: Reported on 07/29/2024 10/02/22   Ladora Congress, PA  doxycycline  (VIBRAMYCIN ) 100 MG capsule Take 1 capsule (100 mg total) by mouth 2 (two) times daily. 04/13/19   Corlis Burnard DEL, NP  felodipine  (PLENDIL ) 2.5 MG 24 hr tablet Take 2.5 mg by mouth daily as needed.  Patient not taking: Reported on 06/21/2024    [provider]  gabapentin (NEURONTIN) 600 MG tablet Take 600 mg by mouth 3 (three) times daily. 05/12/24   [provider]  HYDROcodone -acetaminophen  (NORCO/VICODIN) 5-325 MG tablet Take 1 tablet by mouth every 6 (six) hours as needed. 12/19/14   [provider]  ibuprofen  (ADVIL ) 800 MG tablet Take 800 mg by mouth as needed.  Patient not taking: Reported on 07/29/2024 08/04/17   [provider]  levocetirizine (XYZAL) 5 MG tablet Take 5 mg by mouth daily. 06/11/24   [provider]  meloxicam (MOBIC) 15 MG tablet Take 15 mg by mouth daily.    [provider]  milk thistle 175 MG tablet Take 175 mg by mouth daily.    [provider]  omeprazole  (PRILOSEC) 40 MG capsule Take 40 mg by mouth as needed. 11/21/18   [provider]  pantoprazole  (PROTONIX ) 20 MG tablet Take 1 tablet (20 mg total) by mouth daily for 14 days. 03/31/24 07/29/24  Curatolo, Adam, DO   tamsulosin (FLOMAX) 0.4 MG CAPS capsule Take 0.4 mg by mouth.    [provider]  tiZANidine (ZANAFLEX) 4 MG tablet Take 4 mg by mouth 2 (two) times daily. 01/14/24   [provider]  Vitamin D , Ergocalciferol , (DRISDOL) 1.25 MG (50000 UNIT) CAPS capsule Take 50,000 Units by mouth every 7 (seven) days. 11/12/18   [provider]    Current Outpatient Medications  Medication Sig Dispense Refill   albuterol  (VENTOLIN  HFA) 108 (90 Base) MCG/ACT inhaler Inhale 2 puffs into the lungs. (Patient taking differently: Inhale 2 puffs into the lungs as needed.)     AMBULATORY NON FORMULARY MEDICATION Medication Name: Diltiazem 2% with Lidocaine  30 g 0   doxycycline  (VIBRAMYCIN ) 100 MG capsule Take 1 capsule (100 mg total) by mouth 2 (two) times daily. 20 capsule 0   felodipine  (PLENDIL ) 2.5 MG 24 hr tablet Take 2.5 mg by mouth daily as needed.  (Patient not taking: No sig reported)     gabapentin (NEURONTIN) 600 MG tablet Take 600 mg by mouth 3 (three) times daily.     HYDROcodone -acetaminophen  (NORCO/VICODIN) 5-325 MG tablet Take 1 tablet by mouth every 6 (six) hours as needed.     ibuprofen  (ADVIL ) 800 MG tablet Take 800 mg by mouth as needed. (Patient not taking: No sig reported)     levocetirizine (XYZAL) 5 MG tablet Take 5 mg by mouth daily.     meloxicam (MOBIC) 15 MG tablet Take 15 mg by mouth daily.     milk thistle 175 MG tablet Take 175 mg by mouth daily.     omeprazole  (PRILOSEC) 40 MG capsule Take 40 mg by mouth as needed.     pantoprazole  (PROTONIX ) 20 MG tablet Take 1 tablet (20 mg total) by mouth daily for 14 days. 14 tablet 0   tamsulosin (FLOMAX) 0.4 MG CAPS capsule Take 0.4 mg by mouth.     tiZANidine (ZANAFLEX) 4 MG tablet Take 4 mg by mouth 2 (two) times daily.     Vitamin D , Ergocalciferol , (DRISDOL) 1.25 MG (50000 UNIT) CAPS capsule Take 50,000 Units by mouth every 7 (seven) days.     Current Facility-Administered Medications  Medication Dose Route Frequency  Provider Last Rate Last Admin   0.9 %  sodium chloride  infusion  500 mL Intravenous Once Jori Thrall, Inocente HERO, MD        Allergies as of 08/19/2024   (No Known Allergies)    Family History  Problem Relation Age of Onset   Colon polyps Mother    Colon cancer Father    Prostate cancer Father    Colon cancer Maternal Uncle    Esophageal cancer Neg Hx    Rectal cancer Neg Hx    Stomach cancer Neg Hx     Social History   Socioeconomic History   Marital status: Single  Spouse name: Not on file   Number of children: Not on file   Years of education: Not on file   Highest education level: Not on file  Occupational History   Not on file  Tobacco Use   Smoking status: Former    Current packs/day: 0.00    Average packs/day: 0.3 packs/day for 11.0 years (2.8 ttl pk-yrs)    Types: Cigarettes    Start date: 02/15/1989    Quit date: 02/16/2000    Years since quitting: 24.5   Smokeless tobacco: Never  Vaping Use   Vaping status: Never Used  Substance and Sexual Activity   Alcohol use: Yes    Alcohol/week: 1.0 standard drink of alcohol    Types: 1 Glasses of wine per week   Drug use: No   Sexual activity: Not Currently  Other Topics Concern   Not on file  Social History Narrative   Not on file   Social Drivers of Health   Financial Resource Strain: Not on file  Food Insecurity: Not on file  Transportation Needs: Not on file  Physical Activity: Not on file  Stress: Not on file  Social Connections: Unknown (02/13/2022)   Received from [facility]   Social Network    Social Network: Not on file  Intimate Partner Violence: Unknown (01/09/2022)   Received from [facility]   HITS    Physically Hurt: Not on file    Insult or Talk Down To: Not on file    Threaten Physical Harm: Not on file    Scream or Curse: Not on file    Review of Systems:  All other review of systems negative except as mentioned in the HPI.  Physical Exam: Vital signs BP 111/70   Pulse 67   Temp  97.9 F (36.6 C) (Temporal)   Resp 14   Ht 5' 10 (1.778 m)   Wt 169 lb (76.7 kg)   SpO2 100%   BMI 24.25 kg/m   General:   Alert,  Well-developed, well-nourished, pleasant and cooperative in NAD Airway:  Mallampati 2 Lungs:  Clear throughout to auscultation.   Heart:  Regular rate and rhythm; no murmurs, clicks, rubs,  or gallops. Abdomen:  Soft, nontender and nondistended. Normal bowel sounds.   Neuro/Psych:  Normal mood and affect. A and O x 3  Inocente Hausen, MD [facility]

## 2024-08-19 ENCOUNTER — Encounter: Payer: Self-pay | Admitting: Pediatrics

## 2024-08-19 ENCOUNTER — Ambulatory Visit: Admitting: Pediatrics

## 2024-08-19 VITALS — BP 118/77 | HR 69 | Temp 97.9°F | Resp 19 | Ht 70.0 in | Wt 169.0 lb

## 2024-08-19 DIAGNOSIS — K648 Other hemorrhoids: Secondary | ICD-10-CM | POA: Diagnosis not present

## 2024-08-19 DIAGNOSIS — R935 Abnormal findings on diagnostic imaging of other abdominal regions, including retroperitoneum: Secondary | ICD-10-CM | POA: Diagnosis not present

## 2024-08-19 DIAGNOSIS — K644 Residual hemorrhoidal skin tags: Secondary | ICD-10-CM

## 2024-08-19 DIAGNOSIS — R194 Change in bowel habit: Secondary | ICD-10-CM | POA: Diagnosis not present

## 2024-08-19 DIAGNOSIS — Z800 Family history of malignant neoplasm of digestive organs: Secondary | ICD-10-CM

## 2024-08-19 DIAGNOSIS — K625 Hemorrhage of anus and rectum: Secondary | ICD-10-CM

## 2024-08-19 DIAGNOSIS — Z1211 Encounter for screening for malignant neoplasm of colon: Secondary | ICD-10-CM

## 2024-08-19 DIAGNOSIS — Z83719 Family history of colon polyps, unspecified: Secondary | ICD-10-CM

## 2024-08-19 MED ORDER — SODIUM CHLORIDE 0.9 % IV SOLN: 500.0000 mL | Freq: Once | INTRAVENOUS | Status: DC

## 2024-08-19 NOTE — Patient Instructions (Signed)
 YOU HAD AN ENDOSCOPIC PROCEDURE TODAY AT THE [facility]:   Refer to the procedure report that was given to you for any specific questions about what was found during the examination.  If the procedure report does not answer your questions, please call your gastroenterologist to clarify.  If you requested that your care partner not be given the details of your procedure findings, then the procedure report has been included in a sealed envelope for you to review at your convenience later.  YOU SHOULD EXPECT: Some feelings of bloating in the abdomen. Passage of more gas than usual.  Walking can help get rid of the air that was put into your GI tract during the procedure and reduce the bloating. If you had a lower endoscopy (such as a colonoscopy or flexible sigmoidoscopy) you may notice spotting of blood in your stool or on the toilet paper. If you underwent a bowel prep for your procedure, you may not have a normal bowel movement for a few days.  Please Note:  You might notice some irritation and congestion in your nose or some drainage.  This is from the oxygen used during your procedure.  There is no need for concern and it should clear up in a day or so.  SYMPTOMS TO REPORT IMMEDIATELY:  Following lower endoscopy (colonoscopy or flexible sigmoidoscopy):  Excessive amounts of blood in the stool  Significant tenderness or worsening of abdominal pains  Swelling of the abdomen that is new, acute  Fever of 100F or higher  Resume previous diet Repeat colonoscopy in 5 years Return to GI clinic in 2-3 months with Dr. Suzann or Deanna/Amanda Handout on hemorrhoids given   For urgent or emergent issues, a gastroenterologist can be reached at any hour by calling [phone]. Do not use MyChart messaging for urgent concerns.    DIET:  We do recommend a small meal at first, but then you may proceed to your regular diet.  Drink plenty of fluids but you should avoid alcoholic beverages  for 24 hours.  ACTIVITY:  You should plan to take it easy for the rest of today and you should NOT DRIVE or use heavy machinery until tomorrow (because of the sedation medicines used during the test).    FOLLOW UP: Our staff will call the number listed on your records the next business day following your procedure.  We will call around 7:15- 8:00 am to check on you and address any questions or concerns that you may have regarding the information given to you following your procedure. If we do not reach you, we will leave a message.     If any biopsies were taken you will be contacted by phone or by letter within the next 1-3 weeks.  Please call us  at [phone] if you have not heard about the biopsies in 3 weeks.    SIGNATURES/CONFIDENTIALITY: You and/or your care partner have signed paperwork which will be entered into your electronic medical record.  These signatures attest to the fact that that the information above on your After Visit Summary has been reviewed and is understood.  Full responsibility of the confidentiality of this discharge information lies with you and/or your care-partner.

## 2024-08-19 NOTE — Op Note (Signed)
 [facility] Patient Name: George Gonzales Procedure Date: 08/19/2024 9:25 AM MRN: [id number] Endoscopist: Inocente Hausen , MD, [id number] Age: 48 Referring MD:  Date of Birth: Jun 04, 1976 Gender: Male Account #: [id number] Procedure:                Colonoscopy Indications:              This is the patient's first colonoscopy, Rectal                            bleeding, Family history of colon cancer in                            multiple second-degree relatives, Family history of                            colonic polyps in a first-degree relative, Abnormal                            CT of the GI tract, Incidental change in bowel                            habits noted Medicines:                Monitored Anesthesia Care Procedure:                Pre-Anesthesia Assessment:                           - Prior to the procedure, a History and Physical                            was performed, and patient medications and                            allergies were reviewed. The patient's tolerance of                            previous anesthesia was also reviewed. The risks                            and benefits of the procedure and the sedation                            options and risks were discussed with the patient.                            All questions were answered, and informed consent                            was obtained. Prior Anticoagulants: The patient has                            taken no anticoagulant or antiplatelet agents. ASA  Grade Assessment: II - A patient with mild systemic                            disease. After reviewing the risks and benefits,                            the patient was deemed in satisfactory condition to                            undergo the procedure.                           After obtaining informed consent, the colonoscope                            was passed under direct vision. Throughout the                             procedure, the patient's blood pressure, pulse, and                            oxygen saturations were monitored continuously. The                            Olympus Scope SN: [id number] was introduced through                            the anus and advanced to the terminal ileum. The                            colonoscopy was performed without difficulty. The                            patient tolerated the procedure well. The quality                            of the bowel preparation was adequate to identify                            polyps greater than 5 mm in size. The terminal                            ileum, ileocecal valve, appendiceal orifice, and                            rectum were photographed. Scope In: 9:39:23 AM Scope Out: 9:55:11 AM Scope Withdrawal Time: 0 hours 10 minutes 2 seconds  Total Procedure Duration: 0 hours 15 minutes 48 seconds  Findings:                 Skin tags were found on perianal exam.                           The digital rectal exam was normal. Pertinent  negatives include normal sphincter tone and no                            palpable rectal lesions.                           The colon (entire examined portion) appeared normal.                           The terminal ileum appeared normal.                           Internal hemorrhoids were found during retroflexion. Complications:            No immediate complications. Estimated blood loss:                            None. Estimated Blood Loss:     Estimated blood loss: none. Impression:               - Perianal skin tags found on perianal exam.                           - The entire examined colon is normal.                           - The examined portion of the ileum was normal.                           - Internal hemorrhoids. This is the likely source                            of rectal bleeding.                           - No specimens  collected. Recommendation:           - Discharge patient to home (ambulatory).                           - Repeat colonoscopy in 5 years for screening                            purposes given family history of colorectal cancer                            and multiple second-degree relatives and a                            first-degree relative with polyps.                           - The findings and recommendations were discussed                            with the patient's family.                           -  Return to GI clinic in 2 -3 months with Dr.                            Suzann or APP (Deanna May or Alan Coombs).                           - The findings and recommendations were discussed                            with the patient's family.                           - Patient has a contact number available for                            emergencies. The signs and symptoms of potential                            delayed complications were discussed with the                            patient. Return to normal activities tomorrow.                            Written discharge instructions were provided to the                            patient. Inocente Suzann, MD 08/19/2024 10:01:25 AM This report has been signed electronically.

## 2024-08-19 NOTE — Progress Notes (Signed)
 Pt's states no medical or surgical changes since previsit or office visit.

## 2024-08-19 NOTE — Progress Notes (Signed)
 Sedate, gd SR, tolerated procedure well, VSS, report to RN

## 2024-08-20 ENCOUNTER — Telehealth: Payer: Self-pay

## 2024-08-20 NOTE — Telephone Encounter (Signed)
Left HIPAA compliant voicemail. 

## 2024-10-17 NOTE — Progress Notes (Unsigned)
 "  [facility] Return Visit   Referring Provider Shelda Atlas, MD [address] [place],  [place] [address]  Primary Care Provider Shelda Atlas, MD  Patient Profile: George Gonzales is a 49 y.o. male who returns to the [facility] for follow-up of the problem(s) noted below.  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  History of Present Illness George Gonzales is a 49 year old gentleman with a past medical history noteworthy for GERD, prostate CA (2014-2015), TIA (2016), History of MI (age 17), genital syphilis (1997), Raynaud's disease, fibromyalgia who returns to the gastroenterology office for follow-up of GERD, abdominal pain, constipation, family history of colon cancer and colon polyps as well as hepatic focal nodular hyperplasia  Current GI Meds    Interval History     2 maternal uncles with colorectal cancer Mother with precancerous colon polyps  GI Review of Symptoms Significant for {[id number]}. Otherwise negative.  General Review of Systems  Review of systems is significant for the pertinent positives and negatives as listed per the HPI.  Full ROS is otherwise negative.  Past Medical History   Past Medical History:  Diagnosis Date   Arthritis    CHF (congestive heart failure) (HCC)    Constipation    COPD (chronic obstructive pulmonary disease) (HCC)    GERD (gastroesophageal reflux disease)    Gross hematuria    History of chest pain    History of chronic bronchitis    History of kidney stones    History of MI (myocardial infarction)    pt states at age 62 had Stress-induced heart attack/  also states  had myoview Jan 2016 w/ PCP and it was normal   History of primary genital syphilis    1997-- treated w/ penicillin  injection's   Hyperlipidemia    Hypertension    Hypotension    Lower urinary tract symptoms (LUTS)    Myocardial infarction (HCC)    Neuromuscular disorder (HCC)    Prediabetes    Prostate CA (HCC)    hx of prostate CA per pt   Raynaud's syndrome    TIA (transient ischemic attack) 2016     Past Surgical History   Past Surgical History:  Procedure Laterality Date   CYSTOSCOPY N/A 02/17/2015   Procedure: CYSTOSCOPY;  Surgeon: Oliva Oiler, MD;  Location: [facility];  Service: Urology;  Laterality: N/A;   NO PAST SURGERIES       Allergies and Medications   Allergies[1] @MEDSTODAY @  Family His   Family History  Problem Relation Age of Onset   Colon polyps Mother    Colon cancer Father    Prostate cancer Father    Colon cancer Maternal Uncle    Esophageal cancer Neg Hx    Rectal cancer Neg Hx    Stomach cancer Neg Hx    GI Specific Family History: {[id number]}   Social History   Social History[2] Midas reports that he quit smoking about 24 years ago. His smoking use included cigarettes. He started smoking about 35 years ago. He has a 2.8 pack-year smoking history. He has never used smokeless tobacco. He reports current alcohol use of about 1.0 standard drink of alcohol per week.  He reports that he does not use drugs.  Vital Signs and Physical Examination   There were no vitals filed for this visit. There is no height or weight on file to calculate BMI.    General: Well developed, well nourished, no acute distress Head: Normocephalic and atraumatic Eyes: Sclerae anicteric, EOMI Ears: Normal auditory acuity Mouth: No deformities or lesions noted Lungs: Clear throughout to auscultation Heart: Regular rate and rhythm; No murmurs, rubs or bruits Abdomen: Soft, non tender and non distended. No masses, hepatosplenomegaly or hernias  noted. Normal Bowel sounds Rectal: Musculoskeletal: Symmetrical with no gross deformities  Pulses:  Normal pulses noted Extremities: No edema or deformities noted Neurological: Alert oriented x 4, grossly nonfocal Psychological:  Alert and cooperative. Normal mood and affect   Review of Data   The following data was reviewed at the time of this encounter:   Laboratory Studies      Latest Ref Rng & Units 03/31/2024    3:24 PM 03/10/2023   12:00 AM 09/25/2022    4:00 PM  CBC  WBC 4.0 - 10.5 K/uL 6.0  6.0  6.6   Hemoglobin 13.0 - 17.0 g/dL 84.6  83.9  84.1   Hematocrit 39.0 - 52.0 % 44.4  47.3  45.1   Platelets 150 - 400 K/uL 271  270  296     Lab Results  Component Value Date   LIPASE 26 03/31/2024      Latest Ref Rng & Units 03/31/2024    3:24 PM 03/10/2023   12:00 AM 09/25/2022    4:00 PM  CMP  Glucose 70 - 99 mg/dL 97  88  91   BUN 6 - 20 mg/dL 20  12  9    Creatinine 0.61 - 1.24 mg/dL 8.96  8.97  8.84   Sodium 135 - 145 mmol/L 138  139  137   Potassium 3.5 - 5.1 mmol/L 4.1  4.0  4.5   Chloride 98 - 111 mmol/L 104  104  101   CO2 22 - 32 mmol/L 25  25  24    Calcium 8.9 - 10.3 mg/dL 9.7  9.5  89.8   Total Protein 6.5 - 8.1 g/dL 7.5   7.7   Total Bilirubin 0.0 - 1.2 mg/dL 0.6   0.6   Alkaline Phos 38 - 126 U/L 68     AST 15 - 41 U/L 24   25   ALT 0 - 44 U/L 24   29    Lab Results  Component Value Date   ESRSEDRATE 6 05/13/2024   Lab Results  Component Value Date   CRP <1.0 05/13/2024     Imaging Studies  MRI abdomen 05/24/2024 1. There is a 1.6 x 1.8 x 1.8 cm lesion in the subcapsular left hepatic lobe, segment 4B, which corresponds to the lesion seen on the recent CT angiography exam. This is favored to represent focal nodular hyperplasia. Follow-up ultrasound or MRI abdomen with and without contrast is recommended in 6 months to document stability. 2. 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 3. Otherwise essentially unremarkable exam.  CT angio  chest, abdomen pelvis 03/31/2024 1. No acute intrathoracic, abdominal, or pelvic pathology. No aortic aneurysm or dissection. 2. Indeterminate hepatic and right adrenal lesions. Further characterization with MRI on a nonemergent/outpatient basis recommended. 3. Mild irregular thickening of the rectal wall may be related to rectal fold or retained stool. Underlying mass is less likely but not excluded. Correlation with clinical exam  recommended.   GI Procedures and Studies  Colonoscopy 08/19/2024 Perianal skin tags Normal colon and terminal ileum Internal hemorrhoids-this is likely source of rectal bleeding  Clinical Impression  It is my clinical impression that George Gonzales is a 49 y.o. male with;  ***  Plan  *** *** *** *** ***   Planned Follow Up No follow-ups on file.  The patient or caregiver verbalized understanding of the material covered, with no barriers to understanding. All questions were answered. Patient or caregiver is agreeable with the plan outlined above.    It was a pleasure to see George Gonzales.  If you have any questions or concerns regarding this evaluation, do not hesitate to contact me.  Inocente Hausen, MD [facility]     [1] No Known Allergies [2]  Social History Tobacco Use   Smoking status: Former    Current packs/day: 0.00    Average packs/day: 0.3 packs/day for 11.0 years (2.8 ttl pk-yrs)    Types: Cigarettes    Start date: 02/15/1989    Quit date: 02/16/2000    Years since quitting: 24.6   Smokeless tobacco: Never  Vaping Use   Vaping status: Never Used  Substance Use Topics   Alcohol use: Yes    Alcohol/week: 1.0 standard drink of alcohol    Types: 1 Glasses of wine per week   Drug use: No   "

## 2024-10-19 ENCOUNTER — Ambulatory Visit: Admitting: Pediatrics

## 2024-11-09 ENCOUNTER — Other Ambulatory Visit: Payer: Self-pay

## 2024-11-09 ENCOUNTER — Telehealth: Payer: Self-pay

## 2024-11-09 DIAGNOSIS — K769 Liver disease, unspecified: Secondary | ICD-10-CM

## 2024-11-09 DIAGNOSIS — E279 Disorder of adrenal gland, unspecified: Secondary | ICD-10-CM

## 2024-11-20 ENCOUNTER — Ambulatory Visit (HOSPITAL_COMMUNITY)
